# Patient Record
Sex: Female | Born: 1975
Health system: Southern US, Community
[De-identification: ages and names within clinical notes are randomized; demographics above are authoritative.]

## PROBLEM LIST (undated history)

## (undated) DIAGNOSIS — D219 Benign neoplasm of connective and other soft tissue, unspecified: Secondary | ICD-10-CM

## (undated) DIAGNOSIS — Z789 Other specified health status: Secondary | ICD-10-CM

## (undated) DIAGNOSIS — K5792 Diverticulitis of intestine, part unspecified, without perforation or abscess without bleeding: Secondary | ICD-10-CM

## (undated) HISTORY — DX: Benign neoplasm of connective and other soft tissue, unspecified: D21.9

---

## 2008-07-22 ENCOUNTER — Emergency Department (HOSPITAL_COMMUNITY): Admission: EM | Admit: 2008-07-22 | Discharge: 2008-07-22 | Payer: Self-pay | Admitting: Family Medicine

## 2008-07-29 ENCOUNTER — Ambulatory Visit (HOSPITAL_COMMUNITY): Admission: RE | Admit: 2008-07-29 | Discharge: 2008-07-29 | Payer: Self-pay | Admitting: Chiropractic Medicine

## 2010-12-27 ENCOUNTER — Other Ambulatory Visit: Payer: Self-pay | Admitting: Obstetrics & Gynecology

## 2010-12-27 DIAGNOSIS — O09529 Supervision of elderly multigravida, unspecified trimester: Secondary | ICD-10-CM

## 2011-01-22 LAB — HEPATITIS B SURFACE ANTIGEN: Hepatitis B Surface Ag: NEGATIVE

## 2011-01-22 LAB — ABO/RH: RH Type: POSITIVE

## 2011-01-22 LAB — HIV ANTIBODY (ROUTINE TESTING W REFLEX): HIV: NONREACTIVE

## 2011-01-23 ENCOUNTER — Other Ambulatory Visit: Payer: Self-pay | Admitting: Obstetrics & Gynecology

## 2011-01-23 ENCOUNTER — Ambulatory Visit (HOSPITAL_COMMUNITY)
Admission: RE | Admit: 2011-01-23 | Discharge: 2011-01-23 | Disposition: A | Discharge status: Home / Self Care | Payer: BC Managed Care – PPO | Source: Ambulatory Visit | Attending: Obstetrics & Gynecology | Admitting: Obstetrics & Gynecology

## 2011-01-23 ENCOUNTER — Encounter (HOSPITAL_COMMUNITY): Payer: Self-pay

## 2011-01-23 VITALS — BP 121/75 | HR 102 | Wt 147.0 lb

## 2011-01-23 DIAGNOSIS — Z0489 Encounter for examination and observation for other specified reasons: Secondary | ICD-10-CM

## 2011-01-23 DIAGNOSIS — O358XX0 Maternal care for other (suspected) fetal abnormality and damage, not applicable or unspecified: Secondary | ICD-10-CM | POA: Insufficient documentation

## 2011-01-23 DIAGNOSIS — O3410 Maternal care for benign tumor of corpus uteri, unspecified trimester: Secondary | ICD-10-CM | POA: Insufficient documentation

## 2011-01-23 DIAGNOSIS — O09529 Supervision of elderly multigravida, unspecified trimester: Secondary | ICD-10-CM | POA: Insufficient documentation

## 2011-01-23 DIAGNOSIS — Z1389 Encounter for screening for other disorder: Secondary | ICD-10-CM | POA: Insufficient documentation

## 2011-01-23 DIAGNOSIS — Z363 Encounter for antenatal screening for malformations: Secondary | ICD-10-CM | POA: Insufficient documentation

## 2011-01-23 NOTE — Progress Notes (Signed)
Genetic Counseling  High-Risk Gestation Note  Appointment Date:  01/23/2011 Referred By: Monica Hendrix, * Date of Birth:  Nov 20, 1975 Partner:  Monica Amour    [name] was referred for consultation for prenatal diagnosis given advanced maternal age.   Both family histories were reviewed and found to be contibutory  for the father of the pregnancy's brother born with a structural difference on his head, described as a knot, and spinal problems.  This relative reportedly had surgical correction of his head. Very limited information was known regarding his specific condition. He reportedly had no long-term effects and is healthy. Without further information regarding the provided family history, an accurate genetic risk cannot be calculated.   Further genetic counseling is warranted if more information is obtained.  We discussed sickle cell anemia (SCA) including: the features of SCA, autosomal recessive inheritance, the approximate 1 in 10 carrier frequency in the [place] American population, and the availability of carrier testing.  We also discussed that testing for SCA can be done prenatally if needed, and it is also routinely screened for on the newborn screening panel.  Medical records indicated that Monica Hendrix had sickle cell screening performed through her primary OB office, which was negative.   They were counseled regarding maternal age and the association with risk for chromosome conditions due to nondisjunction with aging of the ova.   We reviewed chromosomes, nondisjunction, and the associated 1 in 111 risk for fetal aneuploidy in the second trimester related to a maternal age of 3 at delivery.  They were counseled that the risk for aneuploidy decreases as gestational age increases, accounting for those pregnancies which spontaneously abort.  We specifically discussed Down syndrome (trisomy 21) and trisomies 57 and 18 including the common features and prognoses of each.    We  reviewed available screening and diagnostic options.  Regarding screening tests, we discussed the options of Quad screen and ultrasound.  They understand that screening tests are used to modify a patient's a priori risk for aneuploidy, typically based on age.  This estimate provides a pregnancy specific risk assessment.  We also reviewed the availability of diagnostic option of amniocentesis. A risk of 1 in 200-300 was given for amniocentesis, the primary complication being spontaneous pregnancy loss.  We discussed the risks, limitations, and benefits of each. It was discussed that not all birth defects or genetic conditions can be identified with these procedures. They were counseled that 50-80% of fetuses with Down syndrome and up to 90% of fetuses with trisomies 13 and 18, when well visualized, have detectable anomalies or soft markers by ultrasound. After reviewing these options, Monica Hendrix elected to proceed with targeted ultrasound only and declined Quad screening and amniocentesis at this time. Ultrasound performed today indicated the pregnancy to be 16 weeks and 2 days gestation. Visualized fetal anatomy appeared normal. However, fetal anatomic survey was limited due to gestational age.  Patient will return in 3 weeks to complete anatomic survey. She wishes to pursue these options to help ascertain her pregnancy specific risks for aneuploidy and will consider Quad screening and amniocentesis pending the results of follow-up ultrasound. They understand that ultrasound cannot rule out all birth defects or genetic syndromes.   The patient denied exposure to environmental toxins or chemical agents.  She denied the use of alcohol, tobacco or street drugs.  She denied significant viral illnesses during the course of her pregnancy.  Her medical and surgical history were noncontributory.    A complete obstetrical  ultrasound was performed at the time of today's evaluation.  The ultrasound report is reported  separately.      We counseled the patient for approximately 40 minutes regarding the above risks.     Monica Braun Jayron Maqueda, MS, [facility] 01/23/2011

## 2011-01-31 ENCOUNTER — Ambulatory Visit (HOSPITAL_COMMUNITY): Payer: BC Managed Care – PPO

## 2011-02-14 ENCOUNTER — Ambulatory Visit (HOSPITAL_COMMUNITY)
Admission: RE | Admit: 2011-02-14 | Discharge: 2011-02-14 | Disposition: A | Discharge status: Home / Self Care | Payer: BC Managed Care – PPO | Source: Ambulatory Visit | Attending: Obstetrics & Gynecology | Admitting: Obstetrics & Gynecology

## 2011-02-14 ENCOUNTER — Encounter (HOSPITAL_COMMUNITY): Payer: Self-pay

## 2011-02-14 ENCOUNTER — Other Ambulatory Visit: Payer: Self-pay | Admitting: Obstetrics & Gynecology

## 2011-02-14 VITALS — BP 123/72 | HR 95 | Wt 149.0 lb

## 2011-02-14 DIAGNOSIS — Z363 Encounter for antenatal screening for malformations: Secondary | ICD-10-CM | POA: Insufficient documentation

## 2011-02-14 DIAGNOSIS — O3410 Maternal care for benign tumor of corpus uteri, unspecified trimester: Secondary | ICD-10-CM | POA: Insufficient documentation

## 2011-02-14 DIAGNOSIS — O358XX0 Maternal care for other (suspected) fetal abnormality and damage, not applicable or unspecified: Secondary | ICD-10-CM | POA: Insufficient documentation

## 2011-02-14 DIAGNOSIS — Z0489 Encounter for examination and observation for other specified reasons: Secondary | ICD-10-CM

## 2011-02-14 DIAGNOSIS — Z1389 Encounter for screening for other disorder: Secondary | ICD-10-CM | POA: Insufficient documentation

## 2011-02-14 DIAGNOSIS — O09529 Supervision of elderly multigravida, unspecified trimester: Secondary | ICD-10-CM | POA: Insufficient documentation

## 2011-02-14 NOTE — Progress Notes (Signed)
Encounter addended by: Macarthur Critchley. Rachel Bo on: 02/14/2011  2:43 PM<BR>     Documentation filed: Orders

## 2011-02-14 NOTE — Progress Notes (Signed)
Encounter addended by: Macarthur Critchley. Rachel Bo on: 02/14/2011  2:41 PM<BR>     Documentation filed: Orders

## 2011-02-14 NOTE — Progress Notes (Signed)
See ultrasound report in [facility] 

## 2011-03-28 ENCOUNTER — Ambulatory Visit (HOSPITAL_COMMUNITY): Payer: BC Managed Care – PPO

## 2011-04-14 ENCOUNTER — Other Ambulatory Visit: Payer: Self-pay | Admitting: Obstetrics

## 2011-04-14 ENCOUNTER — Encounter (HOSPITAL_COMMUNITY): Payer: Self-pay | Admitting: Anesthesiology

## 2011-04-14 ENCOUNTER — Encounter (HOSPITAL_COMMUNITY): Payer: Self-pay

## 2011-04-14 ENCOUNTER — Inpatient Hospital Stay (HOSPITAL_COMMUNITY)
Admission: AD | Admit: 2011-04-14 | Discharge: 2011-04-17 | DRG: 371 | Disposition: A | Discharge status: Home / Self Care | Payer: BC Managed Care – PPO | Source: Ambulatory Visit | Attending: Obstetrics & Gynecology | Admitting: Obstetrics & Gynecology

## 2011-04-14 ENCOUNTER — Inpatient Hospital Stay (HOSPITAL_COMMUNITY): Payer: BC Managed Care – PPO | Admitting: Anesthesiology

## 2011-04-14 ENCOUNTER — Encounter (HOSPITAL_COMMUNITY)
Admission: AD | Disposition: A | Discharge status: Home / Self Care | Payer: Self-pay | Source: Ambulatory Visit | Attending: Obstetrics & Gynecology

## 2011-04-14 ENCOUNTER — Inpatient Hospital Stay (HOSPITAL_COMMUNITY): Payer: BC Managed Care – PPO

## 2011-04-14 DIAGNOSIS — O321XX0 Maternal care for breech presentation, not applicable or unspecified: Secondary | ICD-10-CM | POA: Diagnosis present

## 2011-04-14 DIAGNOSIS — O34219 Maternal care for unspecified type scar from previous cesarean delivery: Principal | ICD-10-CM | POA: Diagnosis present

## 2011-04-14 DIAGNOSIS — O09529 Supervision of elderly multigravida, unspecified trimester: Secondary | ICD-10-CM | POA: Diagnosis present

## 2011-04-14 DIAGNOSIS — D62 Acute posthemorrhagic anemia: Secondary | ICD-10-CM | POA: Diagnosis not present

## 2011-04-14 DIAGNOSIS — O42919 Preterm premature rupture of membranes, unspecified as to length of time between rupture and onset of labor, unspecified trimester: Secondary | ICD-10-CM

## 2011-04-14 HISTORY — DX: Other specified health status: Z78.9

## 2011-04-14 LAB — WET PREP, GENITAL
Clue Cells Wet Prep HPF POC: NONE SEEN
Trich, Wet Prep: NONE SEEN
Yeast Wet Prep HPF POC: NONE SEEN

## 2011-04-14 LAB — DIFFERENTIAL
Band Neutrophils: 1 % (ref 0–10)
Basophils Absolute: 0 10*3/uL (ref 0.0–0.1)
Basophils Relative: 0 % (ref 0–1)
Myelocytes: 0 %
Promyelocytes Absolute: 0 %

## 2011-04-14 LAB — CBC
HCT: 35.1 % — ABNORMAL LOW (ref 36.0–46.0)
Hemoglobin: 11.7 g/dL — ABNORMAL LOW (ref 12.0–15.0)
MCH: 32.6 pg (ref 26.0–34.0)
MCHC: 33.3 g/dL (ref 30.0–36.0)

## 2011-04-14 LAB — RPR: RPR Ser Ql: NONREACTIVE

## 2011-04-14 SURGERY — Surgical Case
Anesthesia: Spinal | Site: Abdomen | Wound class: Clean Contaminated

## 2011-04-14 MED ORDER — PHENYLEPHRINE 40 MCG/ML (10ML) SYRINGE FOR IV PUSH (FOR BLOOD PRESSURE SUPPORT)
PREFILLED_SYRINGE | INTRAVENOUS | Status: AC
  Filled 2011-04-14: qty 10

## 2011-04-14 MED ORDER — DOCUSATE SODIUM 100 MG PO CAPS: 100.0000 mg | ORAL_CAPSULE | Freq: Every day | ORAL | Status: DC

## 2011-04-14 MED ORDER — PHENYLEPHRINE 40 MCG/ML (10ML) SYRINGE FOR IV PUSH (FOR BLOOD PRESSURE SUPPORT)
PREFILLED_SYRINGE | INTRAVENOUS | Status: AC
  Filled 2011-04-14: qty 5

## 2011-04-14 MED ORDER — OXYTOCIN 10 UNIT/ML IJ SOLN
20.0000 [IU] | INTRAVENOUS | Status: DC | PRN
  Administered 2011-04-14: 20 [IU] via INTRAVENOUS

## 2011-04-14 MED ORDER — LACTATED RINGERS IV SOLN
INTRAVENOUS | Status: DC
  Administered 2011-04-14: 18:00:00 via INTRAVENOUS

## 2011-04-14 MED ORDER — MAGNESIUM SULFATE 40 G IN LACTATED RINGERS - SIMPLE
2.0000 g/h | INTRAVENOUS | Status: DC
  Filled 2011-04-14: qty 500

## 2011-04-14 MED ORDER — SODIUM CHLORIDE 0.9 % IJ SOLN: 3.0000 mL | INTRAMUSCULAR | Status: DC | PRN

## 2011-04-14 MED ORDER — ONDANSETRON HCL 4 MG/2ML IJ SOLN
INTRAMUSCULAR | Status: DC | PRN
  Administered 2011-04-14: 4 mg via INTRAVENOUS

## 2011-04-14 MED ORDER — ERYTHROMYCIN BASE 250 MG PO TABS
250.0000 mg | ORAL_TABLET | Freq: Four times a day (QID) | ORAL | Status: DC
  Filled 2011-04-14: qty 1

## 2011-04-14 MED ORDER — MORPHINE SULFATE (PF) 0.5 MG/ML IJ SOLN
INTRAMUSCULAR | Status: DC | PRN
  Administered 2011-04-14 (×4): 1 mg via INTRAVENOUS
  Administered 2011-04-14: .85 mg via INTRAVENOUS

## 2011-04-14 MED ORDER — CEFAZOLIN SODIUM 1-5 GM-% IV SOLN
INTRAVENOUS | Status: AC
  Filled 2011-04-14: qty 50

## 2011-04-14 MED ORDER — BETAMETHASONE SOD PHOS & ACET 6 (3-3) MG/ML IJ SUSP
12.0000 mg | INTRAMUSCULAR | Status: DC
  Administered 2011-04-14: 12 mg via INTRAMUSCULAR
  Filled 2011-04-14 (×2): qty 2

## 2011-04-14 MED ORDER — LACTATED RINGERS IV SOLN
INTRAVENOUS | Status: DC | PRN
Start: 2011-04-14 — End: 2011-04-14
  Administered 2011-04-14: 23:00:00 via INTRAVENOUS

## 2011-04-14 MED ORDER — FENTANYL CITRATE 0.05 MG/ML IJ SOLN
INTRAMUSCULAR | Status: AC
  Filled 2011-04-14: qty 2

## 2011-04-14 MED ORDER — SODIUM CHLORIDE 0.9 % IJ SOLN: 3.0000 mL | Freq: Two times a day (BID) | INTRAMUSCULAR | Status: DC

## 2011-04-14 MED ORDER — ZOLPIDEM TARTRATE 10 MG PO TABS: 10.0000 mg | ORAL_TABLET | Freq: Every evening | ORAL | Status: DC | PRN

## 2011-04-14 MED ORDER — ONDANSETRON HCL 4 MG/2ML IJ SOLN
INTRAMUSCULAR | Status: AC
  Filled 2011-04-14: qty 2

## 2011-04-14 MED ORDER — MORPHINE SULFATE 0.5 MG/ML IJ SOLN
INTRAMUSCULAR | Status: AC
  Filled 2011-04-14: qty 10

## 2011-04-14 MED ORDER — SODIUM CHLORIDE 0.9 % IV SOLN
2.0000 g | Freq: Four times a day (QID) | INTRAVENOUS | Status: DC
  Administered 2011-04-14 (×2): 2 g via INTRAVENOUS
  Filled 2011-04-14 (×4): qty 2000

## 2011-04-14 MED ORDER — OXYTOCIN 10 UNIT/ML IJ SOLN
INTRAMUSCULAR | Status: AC
  Filled 2011-04-14: qty 4

## 2011-04-14 MED ORDER — PRENATAL PLUS 27-1 MG PO TABS: 1.0000 | ORAL_TABLET | Freq: Every day | ORAL | Status: DC

## 2011-04-14 MED ORDER — CEFAZOLIN SODIUM 1-5 GM-% IV SOLN
1.0000 g | Freq: Three times a day (TID) | INTRAVENOUS | Status: DC
  Administered 2011-04-15 (×2): 1 g via INTRAVENOUS
  Filled 2011-04-14 (×3): qty 50

## 2011-04-14 MED ORDER — AMOXICILLIN 500 MG PO CAPS
500.0000 mg | ORAL_CAPSULE | Freq: Three times a day (TID) | ORAL | Status: DC
  Filled 2011-04-14: qty 1

## 2011-04-14 MED ORDER — MAGNESIUM SULFATE BOLUS VIA INFUSION
4.0000 g | Freq: Once | INTRAVENOUS | Status: AC
  Administered 2011-04-14: 4 g via INTRAVENOUS
  Filled 2011-04-14: qty 500

## 2011-04-14 MED ORDER — CALCIUM CARBONATE ANTACID 500 MG PO CHEW: 2.0000 | CHEWABLE_TABLET | ORAL | Status: DC | PRN

## 2011-04-14 MED ORDER — CITRIC ACID-SODIUM CITRATE 334-500 MG/5ML PO SOLN
ORAL | Status: AC
  Administered 2011-04-14: 30 mL
  Filled 2011-04-14: qty 15

## 2011-04-14 MED ORDER — CEFAZOLIN SODIUM 1-5 GM-% IV SOLN
INTRAVENOUS | Status: DC | PRN
  Administered 2011-04-14: 1 g via INTRAVENOUS

## 2011-04-14 MED ORDER — PHENYLEPHRINE HCL 10 MG/ML IJ SOLN
INTRAMUSCULAR | Status: DC | PRN
  Administered 2011-04-14: 80 ug via INTRAVENOUS
  Administered 2011-04-14 (×2): 120 ug via INTRAVENOUS
  Administered 2011-04-14 (×2): 80 ug via INTRAVENOUS
  Administered 2011-04-14: 120 ug via INTRAVENOUS

## 2011-04-14 MED ORDER — FENTANYL CITRATE 0.05 MG/ML IJ SOLN
INTRAMUSCULAR | Status: DC | PRN
  Administered 2011-04-14: 80 ug via INTRAVENOUS

## 2011-04-14 MED ORDER — SODIUM CHLORIDE 0.9 % IV SOLN
250.0000 mg | Freq: Four times a day (QID) | INTRAVENOUS | Status: DC
  Administered 2011-04-14 (×2): 250 mg via INTRAVENOUS
  Filled 2011-04-14 (×6): qty 250

## 2011-04-14 SURGICAL SUPPLY — 30 items
CLOTH BEACON ORANGE TIMEOUT ST (SAFETY) ×2 IMPLANT
CONTAINER PREFILL 10% NBF 15ML (MISCELLANEOUS) IMPLANT
DERMABOND ADVANCED (GAUZE/BANDAGES/DRESSINGS) ×1
DERMABOND ADVANCED .7 DNX12 (GAUZE/BANDAGES/DRESSINGS) ×1 IMPLANT
DRESSING TELFA 8X3 (GAUZE/BANDAGES/DRESSINGS) IMPLANT
ELECT REM PT RETURN 9FT ADLT (ELECTROSURGICAL) ×2
ELECTRODE REM PT RTRN 9FT ADLT (ELECTROSURGICAL) ×1 IMPLANT
EXTRACTOR VACUUM M CUP 4 TUBE (SUCTIONS) IMPLANT
GAUZE SPONGE 4X4 12PLY STRL LF (GAUZE/BANDAGES/DRESSINGS) IMPLANT
GLOVE BIO SURGEON STRL SZ8.5 (GLOVE) ×4 IMPLANT
GOWN PREVENTION PLUS LG XLONG (DISPOSABLE) ×4 IMPLANT
GOWN PREVENTION PLUS XXLARGE (GOWN DISPOSABLE) ×2 IMPLANT
KIT ABG SYR 3ML LUER SLIP (SYRINGE) ×2 IMPLANT
NEEDLE HYPO 25X5/8 SAFETYGLIDE (NEEDLE) ×2 IMPLANT
NS IRRIG 1000ML POUR BTL (IV SOLUTION) ×2 IMPLANT
PACK C SECTION WH (CUSTOM PROCEDURE TRAY) ×2 IMPLANT
PAD ABD 7.5X8 STRL (GAUZE/BANDAGES/DRESSINGS) IMPLANT
SLEEVE SCD COMPRESS KNEE MED (MISCELLANEOUS) IMPLANT
SUT CHROMIC 0 CT 802H (SUTURE) ×2 IMPLANT
SUT CHROMIC 1 CTX 36 (SUTURE) ×4 IMPLANT
SUT CHROMIC 2 0 SH (SUTURE) ×2 IMPLANT
SUT GUT PLAIN 0 CT-3 TAN 27 (SUTURE) IMPLANT
SUT MON AB 4-0 PS1 27 (SUTURE) ×2 IMPLANT
SUT VIC AB 0 CT1 18XCR BRD8 (SUTURE) IMPLANT
SUT VIC AB 0 CT1 8-18 (SUTURE)
SUT VIC AB 0 CTX 36 (SUTURE) ×2
SUT VIC AB 0 CTX36XBRD ANBCTRL (SUTURE) ×2 IMPLANT
TOWEL OR 17X24 6PK STRL BLUE (TOWEL DISPOSABLE) ×4 IMPLANT
TRAY FOLEY CATH 14FR (SET/KITS/TRAYS/PACK) ×2 IMPLANT
WATER STERILE IRR 1000ML POUR (IV SOLUTION) ×2 IMPLANT

## 2011-04-14 NOTE — H&P (Signed)
Chief Complaint:  Vaginal Discharge   Monica Hendrix is  35 y.o. G3P1011. [redacted]w[redacted]d  Patient's last menstrual period was 09/19/2010.Marland Kitchen  Her pregnancy status is positive.  She presents complaining of Vaginal Discharge Questionable ROM last night requiring pt to wear panty liner and change frequently due to being wet. Onset is described as sudden and has been present for  1 days. Clear fluid  Obstetrical/Gynecological History: OB History    Grav Para Term Preterm Abortions TAB SAB Ect Mult Living   3 1 1  0 1 0 1 0 0 1      Past Medical History: Past Medical History  Diagnosis Date  . No pertinent past medical history     Past Surgical History: Past Surgical History  Procedure Date  . Cesarean section     Family History: No family history on file.  Social History: History  Substance Use Topics  . Smoking status: Unknown If Ever Smoked  . Smokeless tobacco: Not on file  . Alcohol Use: No    Allergies:  Allergies  Allergen Reactions  . Vicodin (Hydrocodone-Acetaminophen) Itching    Prescriptions prior to admission  Medication Sig Dispense Refill  . DM-Phenylephrine-Acetaminophen (TYLENOL COLD HEAD CONGESTION) 10-5-325 MG TABS Take 2 tablets by mouth once. For cold symptoms       . Prenatal Vit-FePoly-FA-DHA (VITAFOL-ONE) 29-1-200 MG CAPS Take 1 tablet by mouth daily.         Review of Systems - Negative except what has been reviewed in the HPI  Physical Exam   Blood pressure 128/90, pulse 121, temperature 98.9 F (37.2 C), temperature source Oral, resp. rate 16, height 5\' 2"  (1.575 m), weight 67.677 kg (149 lb 3.2 oz), last menstrual period 09/19/2010.  General: General appearance - alert, well appearing, and in no distress, oriented to person, place, and time and normal appearing weight Abdomen - gravid, nontender Extremities - peripheral pulses normal, no pedal edema, no clubbing or cyanosis Focused Gynecological Exam: VULVA: normal appearing vulva with no masses,  tenderness or lesions, VAGINA: +pooling clear fluid, CERVIX: visually closed  Labs: + Fern    Assessment: PPROM at [redacted]w[redacted]d Fetal Testing c/w Well-being  Patient Active Problem List  Diagnoses  . AMA (advanced maternal age) multigravida 35+    Plan: Admit to Ante [facility]X for latency [place] and labs pending  Angelea Penny E. 04/14/2011,10:52 AM

## 2011-04-14 NOTE — OR Nursing (Signed)
Fundal massage DLWegner RN, cord ph 7.29

## 2011-04-14 NOTE — Progress Notes (Signed)
  This afternoon Monica Hendrix started contracting and she was started on magnesium 4 g loading and 2 g an hour At first her contractions decreased in intensity But now they have become irregular every 5 minutes and she is 4 cm dilated She will be delivered from by C-section because she had a previous C-section and she's also breech presentation

## 2011-04-14 NOTE — Progress Notes (Signed)
1810-drGaynell Face notified of pt's contraction pattern. Orders received.

## 2011-04-14 NOTE — Progress Notes (Signed)
1701- pt up to empty her bladder.

## 2011-04-14 NOTE — Transfer of Care (Signed)
Immediate Anesthesia Transfer of Care Note  Patient: Monica Hendrix  Procedure(s) Performed:  CESAREAN SECTION - Repeat of baby girl at 2300 APGAR 2/7  Patient Location: PACU  Anesthesia Type: Spinal  Level of Consciousness: awake, alert  and oriented  Airway & Oxygen Therapy: Patient Spontanous Breathing  Post-op Assessment: Report given to PACU RN and Post -op Vital signs reviewed and stable  Post vital signs: stable  Complications: No apparent anesthesia complications

## 2011-04-14 NOTE — Anesthesia Preprocedure Evaluation (Signed)
Anesthesia Evaluation  Name, MR# and DOB Patient awake  General Assessment Comment  Reviewed: Allergy & Precautions, H&P , Patient's Chart, lab work & pertinent test results  Airway Mallampati: II TM Distance: >3 FB Neck ROM: full    Dental  (+) Teeth Intact   Pulmonary  clear to auscultation        Cardiovascular regular Normal    Neuro/Psych    GI/Hepatic   Endo/Other    Renal/GU      Musculoskeletal   Abdominal   Peds  Hematology   Anesthesia Other Findings       Reproductive/Obstetrics (+) Pregnancy                           Anesthesia Physical Anesthesia Plan  ASA: II  Anesthesia Plan: Epidural   Post-op Pain Management:    Induction:   Airway Management Planned:   Additional Equipment:   Intra-op Plan:   Post-operative Plan:   Informed Consent: I have reviewed the patients History and Physical, chart, labs and discussed the procedure including the risks, benefits and alternatives for the proposed anesthesia with the patient or authorized representative who has indicated his/her understanding and acceptance.   Dental Advisory Given  Plan Discussed with: CRNA  Anesthesia Plan Comments: (Lab work confirmed with CRNA in room. Platelets okay. Discussed spinal anesthetic, and patient consents to the procedure:  included risk of possible headache,backache, failed block, allergic reaction, and nerve injury. This patient was asked if she had any questions or concerns before the procedure started. )        Anesthesia Quick Evaluation

## 2011-04-14 NOTE — Progress Notes (Signed)
Pt states that she has started to feel ctx, rates them a 7 on pain scale, Dr.Marshall notified. Will perform and vaginal exam.

## 2011-04-14 NOTE — Brief Op Note (Signed)
04/14/2011  11:41 PM  PATIENT:  Monica Hendrix  35 y.o. female  PRE-OPERATIVE DIAGNOSIS:  breech, ruptured, previous cesarean section  POST-OPERATIVE DIAGNOSIS:  breech, ruptured, previous cesarean section  PROCEDURE:  Procedure(s): CESAREAN SECTION  SURGEON:  Surgeon(s): Kathreen Cosier, MD  PHYSICIAN ASSISTANT:   ASSISTANTS: none   ANESTHESIA:   spinal  OR FLUID I/O:  Total I/O In: 1537.5 [I.V.:1437.5; IV Piggyback:100] Out: 1800 [Urine:1200; Blood:600]  BLOOD ADMINISTERED:none  DRAINS: none   LOCAL MEDICATIONS USED:  NONE  SPECIMEN:  No Specimen  DISPOSITION OF SPECIMEN:  PATHOLOGY  COUNTS:  YES  TOURNIQUET:  * No tourniquets in log *  DICTATION: .Dragon Dictation  PLAN OF CARE: Admit to inpatient   PATIENT DISPOSITION:  PACU - hemodynamically stable.   Delay start of Pharmacological VTE agent (>24hrs) due to surgical blood loss or risk of bleeding:  not applicable       This is Dr. Francoise Ceo dictating the operative note on CAR at a [facility] Preop diagnosis 27 weeks IUP with ruptured membranes breech presentation and labor postop Postop diagnosis the same Anesthesia spinal Surgeon Dr. Lewis Moccasin Procedure Patient placed on the operating table in the supine position after the spinal administered abdomen prepped and draped in the bladder and to the Foley catheter Transverse incision made to the old scar carried him to the rectus fascia fascia cleaned and incised the length of the incision Recti muscles retracted laterally peritoneum incised longitudinally Using the Metzenbaum scissors the bladder was dissected off the lower segment a transverse low uterine incision was made It was noted that the legs and a him in the bottom of the baby were already in the vagina so the patient's legs were frog in the nurse pushed up on the fetus the vagina was a double footling breech female Apgar 27 and delivery was not difficult once they have buttocks were  pushed up into the operative field Placenta was removed manually and sent to pathology cord pH was 7.29 uterine cavity clean with dry laps uterine incision closed in one layer with continuous looped abnormal one chromic hemostasis was satisfactory and the bladder flap protected full chromic the uterus was well contracted tubes and ovaries normal The abdomen was closed in layers with continuous within of 0 chromic to close the peritoneum The fascia was closed with 0 Vicryl And the skin was closed in subcuticular stitch of 4-0 Monocryl blood loss was 600 cc patient tolerated the procedure well taken to recovery room in good condition end of dictation dictated by Dr. Gaynell Face [phone]

## 2011-04-14 NOTE — ED Provider Notes (Signed)
Chief Complaint:  Vaginal Discharge   Monica Hendrix is  35 y.o. G3P1011. [redacted]w[redacted]d  Patient's last menstrual period was 09/19/2010..  Her pregnancy status is positive.  She presents complaining of Vaginal Discharge Questionable ROM last night requiring pt to wear panty liner and change frequently due to being wet. Onset is described as sudden and has been present for  1 days. Clear fluid  Obstetrical/Gynecological History: OB History    Grav Para Term Preterm Abortions TAB SAB Ect Mult Living   3 1 1 0 1 0 1 0 0 1      Past Medical History: Past Medical History  Diagnosis Date  . No pertinent past medical history     Past Surgical History: Past Surgical History  Procedure Date  . Cesarean section     Family History: No family history on file.  Social History: History  Substance Use Topics  . Smoking status: Unknown If Ever Smoked  . Smokeless tobacco: Not on file  . Alcohol Use: No    Allergies:  Allergies  Allergen Reactions  . Vicodin (Hydrocodone-Acetaminophen) Itching    Prescriptions prior to admission  Medication Sig Dispense Refill  . DM-Phenylephrine-Acetaminophen (TYLENOL COLD HEAD CONGESTION) 10-5-325 MG TABS Take 2 tablets by mouth once. For cold symptoms       . Prenatal Vit-FePoly-FA-DHA (VITAFOL-ONE) 29-1-200 MG CAPS Take 1 tablet by mouth daily.         Review of Systems - Negative except what has been reviewed in the HPI  Physical Exam   Blood pressure 128/90, pulse 121, temperature 98.9 F (37.2 C), temperature source Oral, resp. rate 16, height 5' 2" (1.575 m), weight 67.677 kg (149 lb 3.2 oz), last menstrual period 09/19/2010.  General: General appearance - alert, well appearing, and in no distress, oriented to person, place, and time and normal appearing weight Abdomen - gravid, nontender Extremities - peripheral pulses normal, no pedal edema, no clubbing or cyanosis Focused Gynecological Exam: VULVA: normal appearing vulva with no masses,  tenderness or lesions, VAGINA: +pooling clear fluid, CERVIX: visually closed  Labs: + Fern    Assessment: PPROM at [redacted]w[redacted]d Fetal Testing c/w Well-being  Patient Active Problem List  Diagnoses  . AMA (advanced maternal age) multigravida 35+    Plan: Admit to Ante [facility] for latency US and labs pending  Lorre Opdahl E. 04/14/2011,10:52 AM   

## 2011-04-14 NOTE — Progress Notes (Signed)
Onset of leaking fluid since last night, wearing panty liners which are consistently wet, no cramping, no pressure, no bleeding.

## 2011-04-14 NOTE — H&P (Signed)
This is Dr. Francoise Ceo dictating the history and physical on  Monica Hendrix She's a 35 year old gravida 3 para 1001 at 27 weeks and 6 days Her membranes ruptured at 7:30 AM today fluid was clear She is not in labor And her GBS is unknown and she was started on ampicillin and erythromycin IV She's had no problems with this pregnancy and she will be started on her betamethasone 12.5 IM every 12 hours x2 Past medical history negative Past surgical history negative Social history negative System review noncontributory Physical exam well-developed female in no distress HEENT negative Lungs clear Heart regular rhythm no murmurs no gallops Breasts negative Abdomen 28 week size Pelvic deferred Extremities negative

## 2011-04-14 NOTE — Progress Notes (Signed)
VE 4cm, pt now breathing with ctx, Dr.Marshall updated, on his way to perform c/s

## 2011-04-14 NOTE — Anesthesia Procedure Notes (Signed)
Spinal Block  Start time: 04/14/2011 10:32 PM Staffing Anesthesiologist: Jiles Garter Additional Notes Spinal Dosage in OR  Bupivicaine ml       1.6 PFMS04   mcg        150 Fentanyl mcg            20

## 2011-04-15 ENCOUNTER — Encounter (HOSPITAL_COMMUNITY): Payer: Self-pay | Admitting: *Deleted

## 2011-04-15 LAB — CBC
HCT: 29.7 % — ABNORMAL LOW (ref 36.0–46.0)
Hemoglobin: 10.2 g/dL — ABNORMAL LOW (ref 12.0–15.0)
RBC: 3.14 MIL/uL — ABNORMAL LOW (ref 3.87–5.11)
WBC: 24.8 10*3/uL — ABNORMAL HIGH (ref 4.0–10.5)

## 2011-04-15 MED ORDER — DIPHENHYDRAMINE HCL 25 MG PO CAPS: 25.0000 mg | ORAL_CAPSULE | Freq: Four times a day (QID) | ORAL | Status: DC | PRN

## 2011-04-15 MED ORDER — MEPERIDINE HCL 25 MG/ML IJ SOLN: 6.2500 mg | INTRAMUSCULAR | Status: DC | PRN

## 2011-04-15 MED ORDER — ONDANSETRON HCL 4 MG/2ML IJ SOLN: 4.0000 mg | INTRAMUSCULAR | Status: DC | PRN

## 2011-04-15 MED ORDER — IBUPROFEN 600 MG PO TABS: 600.0000 mg | ORAL_TABLET | Freq: Four times a day (QID) | ORAL | Status: DC

## 2011-04-15 MED ORDER — SIMETHICONE 80 MG PO CHEW
80.0000 mg | CHEWABLE_TABLET | ORAL | Status: DC | PRN
  Administered 2011-04-15: 80 mg via ORAL

## 2011-04-15 MED ORDER — KETOROLAC TROMETHAMINE 60 MG/2ML IM SOLN
INTRAMUSCULAR | Status: AC
  Administered 2011-04-15: 60 mg via INTRAMUSCULAR
  Filled 2011-04-15: qty 2

## 2011-04-15 MED ORDER — KETOROLAC TROMETHAMINE 30 MG/ML IJ SOLN: 30.0000 mg | Freq: Four times a day (QID) | INTRAMUSCULAR | Status: DC | PRN

## 2011-04-15 MED ORDER — PRENATAL PLUS 27-1 MG PO TABS
1.0000 | ORAL_TABLET | Freq: Every day | ORAL | Status: DC
  Administered 2011-04-15 – 2011-04-17 (×3): 1 via ORAL
  Filled 2011-04-15 (×4): qty 1

## 2011-04-15 MED ORDER — LACTATED RINGERS IV SOLN: INTRAVENOUS | Status: DC

## 2011-04-15 MED ORDER — METOCLOPRAMIDE HCL 5 MG/ML IJ SOLN: 10.0000 mg | Freq: Three times a day (TID) | INTRAMUSCULAR | Status: DC | PRN

## 2011-04-15 MED ORDER — ZOLPIDEM TARTRATE 5 MG PO TABS: 5.0000 mg | ORAL_TABLET | Freq: Every evening | ORAL | Status: DC | PRN

## 2011-04-15 MED ORDER — NALBUPHINE HCL 10 MG/ML IJ SOLN
5.0000 mg | INTRAMUSCULAR | Status: DC | PRN
  Filled 2011-04-15: qty 1

## 2011-04-15 MED ORDER — LANOLIN HYDROUS EX OINT: 1.0000 "application " | TOPICAL_OINTMENT | CUTANEOUS | Status: DC | PRN

## 2011-04-15 MED ORDER — DIBUCAINE 1 % RE OINT: 1.0000 "application " | TOPICAL_OINTMENT | RECTAL | Status: DC | PRN

## 2011-04-15 MED ORDER — LACTATED RINGERS IV SOLN
INTRAVENOUS | Status: DC
  Administered 2011-04-15: 06:00:00 via INTRAVENOUS

## 2011-04-15 MED ORDER — OXYCODONE-ACETAMINOPHEN 5-325 MG PO TABS: 1.0000 | ORAL_TABLET | ORAL | Status: DC | PRN

## 2011-04-15 MED ORDER — ONDANSETRON HCL 4 MG PO TABS: 4.0000 mg | ORAL_TABLET | ORAL | Status: DC | PRN

## 2011-04-15 MED ORDER — IBUPROFEN 600 MG PO TABS: 600.0000 mg | ORAL_TABLET | Freq: Four times a day (QID) | ORAL | Status: DC | PRN

## 2011-04-15 MED ORDER — IBUPROFEN 600 MG PO TABS
600.0000 mg | ORAL_TABLET | Freq: Four times a day (QID) | ORAL | Status: DC
  Administered 2011-04-15 – 2011-04-17 (×9): 600 mg via ORAL
  Filled 2011-04-15 (×9): qty 1

## 2011-04-15 MED ORDER — MENTHOL 3 MG MT LOZG: 1.0000 | LOZENGE | OROMUCOSAL | Status: DC | PRN

## 2011-04-15 MED ORDER — SIMETHICONE 80 MG PO CHEW
80.0000 mg | CHEWABLE_TABLET | Freq: Three times a day (TID) | ORAL | Status: DC
  Administered 2011-04-15 – 2011-04-17 (×8): 80 mg via ORAL

## 2011-04-15 MED ORDER — SODIUM CHLORIDE 0.9 % IJ SOLN: 3.0000 mL | INTRAMUSCULAR | Status: DC | PRN

## 2011-04-15 MED ORDER — OXYTOCIN 20 UNITS IN LACTATED RINGERS INFUSION - SIMPLE
125.0000 mL/h | INTRAVENOUS | Status: DC
  Filled 2011-04-15: qty 1000

## 2011-04-15 MED ORDER — NALOXONE HCL 0.4 MG/ML IJ SOLN: 0.4000 mg | INTRAMUSCULAR | Status: DC | PRN

## 2011-04-15 MED ORDER — SIMETHICONE 80 MG PO CHEW: 80.0000 mg | CHEWABLE_TABLET | ORAL | Status: DC | PRN

## 2011-04-15 MED ORDER — DIPHENHYDRAMINE HCL 50 MG/ML IJ SOLN
12.5000 mg | INTRAMUSCULAR | Status: DC | PRN
  Administered 2011-04-15: 12.5 mg via INTRAVENOUS

## 2011-04-15 MED ORDER — VITAFOL-ONE 29-1-200 MG PO CAPS
1.0000 | ORAL_CAPSULE | Freq: Every day | ORAL | Status: DC
  Filled 2011-04-15: qty 1

## 2011-04-15 MED ORDER — WITCH HAZEL-GLYCERIN EX PADS: 1.0000 "application " | MEDICATED_PAD | CUTANEOUS | Status: DC | PRN

## 2011-04-15 MED ORDER — SODIUM CHLORIDE 0.9 % IV SOLN
1.0000 ug/kg/h | INTRAVENOUS | Status: DC | PRN
  Filled 2011-04-15: qty 2.5

## 2011-04-15 MED ORDER — DIPHENHYDRAMINE HCL 50 MG/ML IJ SOLN
25.0000 mg | INTRAMUSCULAR | Status: DC | PRN
  Filled 2011-04-15: qty 1

## 2011-04-15 MED ORDER — OXYTOCIN 20 UNITS IN LACTATED RINGERS INFUSION - SIMPLE
125.0000 mL/h | INTRAVENOUS | Status: AC
  Filled 2011-04-15: qty 1000

## 2011-04-15 MED ORDER — TETANUS-DIPHTH-ACELL PERTUSSIS 5-2.5-18.5 LF-MCG/0.5 IM SUSP
0.5000 mL | Freq: Once | INTRAMUSCULAR | Status: AC
  Administered 2011-04-16: 0.5 mL via INTRAMUSCULAR
  Filled 2011-04-15: qty 0.5

## 2011-04-15 MED ORDER — SCOPOLAMINE 1 MG/3DAYS TD PT72
1.0000 | MEDICATED_PATCH | Freq: Once | TRANSDERMAL | Status: DC
  Filled 2011-04-15: qty 1

## 2011-04-15 MED ORDER — ONDANSETRON HCL 4 MG/2ML IJ SOLN: 4.0000 mg | Freq: Three times a day (TID) | INTRAMUSCULAR | Status: DC | PRN

## 2011-04-15 MED ORDER — KETOROLAC TROMETHAMINE 30 MG/ML IJ SOLN
30.0000 mg | Freq: Four times a day (QID) | INTRAMUSCULAR | Status: DC | PRN
  Administered 2011-04-15: 30 mg via INTRAVENOUS
  Filled 2011-04-15: qty 1

## 2011-04-15 MED ORDER — SENNOSIDES-DOCUSATE SODIUM 8.6-50 MG PO TABS
2.0000 | ORAL_TABLET | Freq: Every day | ORAL | Status: DC
  Administered 2011-04-15 – 2011-04-16 (×2): 2 via ORAL

## 2011-04-15 MED ORDER — SIMETHICONE 80 MG PO CHEW: 80.0000 mg | CHEWABLE_TABLET | Freq: Three times a day (TID) | ORAL | Status: DC

## 2011-04-15 MED ORDER — OXYTOCIN 20 UNITS IN LACTATED RINGERS INFUSION - SIMPLE
INTRAVENOUS | Status: AC
  Administered 2011-04-15: 20 [IU]
  Filled 2011-04-15: qty 1000

## 2011-04-15 MED ORDER — TETANUS-DIPHTH-ACELL PERTUSSIS 5-2.5-18.5 LF-MCG/0.5 IM SUSP
0.5000 mL | Freq: Once | INTRAMUSCULAR | Status: DC
  Filled 2011-04-15: qty 0.5

## 2011-04-15 MED ORDER — PRENATAL PLUS 27-1 MG PO TABS: 1.0000 | ORAL_TABLET | Freq: Every day | ORAL | Status: DC

## 2011-04-15 MED ORDER — KETOROLAC TROMETHAMINE 60 MG/2ML IM SOLN
60.0000 mg | Freq: Once | INTRAMUSCULAR | Status: AC | PRN
  Administered 2011-04-15: 60 mg via INTRAMUSCULAR

## 2011-04-15 MED ORDER — SENNOSIDES-DOCUSATE SODIUM 8.6-50 MG PO TABS: 2.0000 | ORAL_TABLET | Freq: Every day | ORAL | Status: DC

## 2011-04-15 MED ORDER — DIPHENHYDRAMINE HCL 25 MG PO CAPS: 25.0000 mg | ORAL_CAPSULE | ORAL | Status: DC | PRN

## 2011-04-15 NOTE — Progress Notes (Signed)
PSYCHOSOCIAL ASSESSMENT ~ MATERNAL/CHILD Name: Monica Andrea Smith_______________________________________         Age___28 weeks gest._______  __________________________________________________________________________________________  Referral Date ___9____/___30_____/_____12___  Reason/Source_NICU support__________________  I. FAMILY/HOME ENVIRONMENT A. Child's Legal Guardian X Parent(s) q Grandparent q Foster parent q DSS _______________________ Name____[name]_____________________________DOB 10__/19_/_1977   Age_____35______ Address [address] [place],  [address]________________________________ Name____[name]___________________________ DOB_[birth year]   Age___43________  Address ____same as MOB  B. Other Household Members/Support Persons Name___Mya Bryant___13yo___________      Relationship_big sister    DOB __[birth year]__1998__        Name_______________________________       Relationship__________________   DOB ____/____/____        Name_______________________________       Relationship__________________   DOB ____/____/____                    Name_______________________________       Relationship__________________   DOB ____/____/____ C.   Other Support _____Many supportive friends and extended family in the area. _______________________________________________________________________ II. PSYCHOSOCIAL DATA A. Information Source X Patient Interview  XFamily Interview           XOther _____chart review and discussion with RN._ B. 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 Cultural and Environment Information Cultural Issues Impacting Care________none_______________________________________________________ III. STRENGTHS X Supportive family/friends   XAdequate Resources X Compliance with medical plan  q Home prepared for Child (including basic supplies)                X Understanding of illness           XOther____have transportation to visit and means to obtain baby items. ___ IV. RISK FACTORS AND CURRENT PROBLEMS        No Problems Noted            Pt Family      Pt Family  Substance Abuse_________________    q  q   Mental Illness_______________ q  q  Family/Relationship Issues   q  q         Abuse/Neglect/Domestic Violence  q  q   Financial Resources    q     q             Transportation                                  q     q                     DSS Involvement                                q     q    Adjustment to Illness                      q              

## 2011-04-15 NOTE — Progress Notes (Signed)
  Postop day 1 Vital signs normal Fundus firm Legs negative No complaints

## 2011-04-15 NOTE — Plan of Care (Signed)
Problem: Discharge Progression Outcomes Goal: Remove staples per MD order Outcome: Not Applicable Date Met:  04/15/11 No staples,incision with skin glue.

## 2011-04-15 NOTE — Anesthesia Postprocedure Evaluation (Signed)
  Anesthesia Post-op Note  Patient: Monica Hendrix  Procedure(s) Performed:  CESAREAN SECTION - Repeat of baby girl at 2300 APGAR 2/7  Patient Location: PACU and Women's Unit  Anesthesia Type: Spinal  Level of Consciousness: awake, alert  and oriented  Airway and Oxygen Therapy: Patient Spontanous Breathing  Post-op Assessment: Patient's Cardiovascular Status Stable and Respiratory Function Stable  Post-op Vital Signs: stable  Complications: No apparent anesthesia complications

## 2011-04-16 ENCOUNTER — Encounter (HOSPITAL_COMMUNITY): Payer: Self-pay | Admitting: Obstetrics

## 2011-04-16 DIAGNOSIS — D62 Acute posthemorrhagic anemia: Secondary | ICD-10-CM | POA: Diagnosis not present

## 2011-04-16 LAB — CBC
HCT: 26.9 % — ABNORMAL LOW (ref 36.0–46.0)
Hemoglobin: 9.3 g/dL — ABNORMAL LOW (ref 12.0–15.0)
RBC: 2.83 MIL/uL — ABNORMAL LOW (ref 3.87–5.11)
RDW: 12.7 % (ref 11.5–15.5)
WBC: 19.3 10*3/uL — ABNORMAL HIGH (ref 4.0–10.5)

## 2011-04-16 LAB — CULTURE, BETA STREP (GROUP B ONLY)

## 2011-04-16 NOTE — Progress Notes (Signed)
UR chart review completed.  

## 2011-04-16 NOTE — Progress Notes (Signed)
  Subjective: POD# 2 s/p Cesarean Delivery.  Indications: breech and PTL  RH status/Rubella reviewed. Feeding: unknown Patient reports tolerating PO.  Denies HA/SOB/C/P/N/V/dizziness.  Reports flatus or BM. Breast symptoms:None.  She reports vaginal bleeding as normal, without clots.  She is ambulating, urinating without difficulty.     Objective: Vital signs in last 24 hours: BP 109/70  Pulse 88  Temp(Src) 97.7 F (36.5 C) (Oral)  Resp 18  Ht 5\' 2"  (1.575 m)  Wt 67.677 kg (149 lb 3.2 oz)  BMI 27.29 kg/m2  SpO2 98%  LMP 09/19/2010  Breastfeeding? Unknown       Physical Exam:  General: alert CV: Regular rate and rhythm Resp: clear Abdomen: soft, nontender, normal bowel sounds Lochia: minimal Uterine Fundus: firm, below umbilicus, nontender Incision: clean, dry and intact Ext: extremities normal, atraumatic, no cyanosis or edema    Basename 04/16/11 0441 04/15/11 0533  HGB 9.3* 10.2*  HCT 26.9* 29.7*      Assessment/Plan: 35 y.o.  status post Cesarean section. POD# 2.   Doing well, stable.               Ambulate IS Routine post-op care  JACKSON-MOORE,Lamaria Hildebrandt A 04/16/2011, 8:25 AM

## 2011-04-16 NOTE — Anesthesia Postprocedure Evaluation (Signed)
  Anesthesia Post-op Note  Patient: Monica Hendrix  Procedure(s) Performed:  CESAREAN SECTION - Repeat of baby girl at 2300 APGAR 2/7   Patient is awake, responsive, moving her legs, and has signs of resolution of her numbness. Pain and nausea are reasonably well controlled. Vital signs are stable and clinically acceptable. Oxygen saturation is clinically acceptable. There are no apparent anesthetic complications at this time. Patient is ready for discharge.

## 2011-04-17 MED ORDER — OXYCODONE-ACETAMINOPHEN 5-325 MG PO TABS: 1.0000 | ORAL_TABLET | ORAL | Status: DC | PRN

## 2011-04-17 MED ORDER — IBUPROFEN 600 MG PO TABS: 600.0000 mg | ORAL_TABLET | Freq: Four times a day (QID) | ORAL | Status: DC

## 2011-04-17 NOTE — Discharge Summary (Signed)
Obstetric Discharge Summary Reason for Admission: rupture of membranes Prenatal Procedures: none Intrapartum Procedures: cesarean: low cervical, transverse Postpartum Procedures: none Complications-Operative and Postpartum: none Hemoglobin  Date Value Range Status  04/16/2011 9.3* 12.0-15.0 (g/dL) Final     HCT  Date Value Range Status  04/16/2011 26.9* 36.0-46.0 (%) Final    Discharge Diagnoses: PTD  Discharge Information: Date: 04/17/2011 Activity: pelvic rest Diet: routine Medications: PNV, Ibuprofen and Percocet Condition: stable Instructions: See above. Discharge to: home Follow-up Information    Follow up with Antionette Char A, MD. Call in 2 weeks.   Contact information:   [address] [place] [address] [phone]          Newborn Data: Live born female  Birth Weight: 2 lb 5 oz (1050 g) APGAR: 2, 7  Home with NICU.  JACKSON-MOORE,Alie Hardgrove A 04/17/2011, 6:35 AM

## 2011-04-17 NOTE — Progress Notes (Signed)
Pt discharged to home with husband.  Condition stable.  Pt and husband ambulated to NICU with intention to leave hospital from there.  No equipment for home ordered at discharge. 

## 2011-04-17 NOTE — Progress Notes (Signed)
Subjective: POD #3 s/p LTC/S  Indication:PTL/breech Patient reports tolerating PO.  Denies HA/SOB/C/P/N/V/dizziness.  Reports flatus or BM. Breast symptoms: None.  She reports vaginal bleeding as normal, without clots.  She is ambulating, urinating without difficulty.     Objective: Vital signs in last 24 hours: BP 115/73  Pulse 82  Temp(Src) 98.5 F (36.9 C) (Oral)  Resp 18  Ht 5\' 2"  (1.575 m)  Wt 67.677 kg (149 lb 3.2 oz)  BMI 27.29 kg/m2  SpO2 98%  LMP 09/19/2010  Breastfeeding? Unknown  Physical Exam:  General: alert CV: Regular rate and rhythm Resp: clear Abdomen: soft, nontender, normal bowel sounds Lochia: minimal Uterine Fundus: firm, below umbilicus, nontender Incision: clean, dry and intact Ext: extremities normal, atraumatic, no cyanosis or edema  Basename 04/16/11 0441 04/15/11 0533  HGB 9.3* 10.2*  HCT 26.9* 29.7*    Assessment/Plan: 35 y.o. status post Cesarean section POD# 3.  normal post-operative exam  Routine post-op care D/C home  JACKSON-MOORE,Frankee Gritz A 04/17/2011, 6:30 AM

## 2011-04-23 ENCOUNTER — Inpatient Hospital Stay (HOSPITAL_COMMUNITY)
Admission: EM | Admit: 2011-04-23 | Discharge: 2011-04-26 | DRG: 376 | Disposition: A | Discharge status: Home / Self Care | Payer: BC Managed Care – PPO | Source: Ambulatory Visit | Attending: Obstetrics & Gynecology | Admitting: Obstetrics & Gynecology

## 2011-04-23 DIAGNOSIS — O8600 Infection of obstetric surgical wound, unspecified: Secondary | ICD-10-CM

## 2011-04-23 DIAGNOSIS — IMO0002 Reserved for concepts with insufficient information to code with codable children: Secondary | ICD-10-CM | POA: Diagnosis present

## 2011-04-23 DIAGNOSIS — O909 Complication of the puerperium, unspecified: Principal | ICD-10-CM | POA: Diagnosis present

## 2011-04-23 LAB — DIFFERENTIAL
Eosinophils Relative: 0 % (ref 0–5)
Lymphs Abs: 2.1 10*3/uL (ref 0.7–4.0)
Monocytes Absolute: 1.8 10*3/uL — ABNORMAL HIGH (ref 0.1–1.0)
Neutro Abs: 19 10*3/uL — ABNORMAL HIGH (ref 1.7–7.7)

## 2011-04-23 LAB — COMPREHENSIVE METABOLIC PANEL
BUN: 11 mg/dL (ref 6–23)
CO2: 25 mEq/L (ref 19–32)
Calcium: 9.1 mg/dL (ref 8.4–10.5)
Creatinine, Ser: 0.62 mg/dL (ref 0.50–1.10)
GFR calc Af Amer: >90 mL/min (ref >90)
GFR calc non Af Amer: >90 mL/min (ref >90)
Glucose, Bld: 103 mg/dL — ABNORMAL HIGH (ref 70–99)
Total Bilirubin: 0.3 mg/dL (ref 0.3–1.2)

## 2011-04-23 LAB — CBC
HCT: 31 % — ABNORMAL LOW (ref 36.0–46.0)
MCH: 32.1 pg (ref 26.0–34.0)
MCV: 93.9 fL (ref 78.0–100.0)
Platelets: 459 10*3/uL — ABNORMAL HIGH (ref 150–400)
RBC: 3.3 MIL/uL — ABNORMAL LOW (ref 3.87–5.11)

## 2011-04-23 MED ORDER — GENTAMICIN SULFATE 40 MG/ML IJ SOLN
Freq: Three times a day (TID) | INTRAVENOUS | Status: DC
  Administered 2011-04-23 – 2011-04-25 (×7): via INTRAVENOUS
  Filled 2011-04-23 (×8): qty 3.5

## 2011-04-23 MED ORDER — ACETAMINOPHEN 325 MG PO TABS: 650.0000 mg | ORAL_TABLET | Freq: Four times a day (QID) | ORAL | Status: DC | PRN

## 2011-04-23 MED ORDER — ZOLPIDEM TARTRATE 10 MG PO TABS: 10.0000 mg | ORAL_TABLET | Freq: Every evening | ORAL | Status: DC | PRN

## 2011-04-23 MED ORDER — LACTATED RINGERS IV SOLN
INTRAVENOUS | Status: DC
  Administered 2011-04-23 – 2011-04-24 (×2): via INTRAVENOUS

## 2011-04-23 MED ORDER — ONDANSETRON HCL 4 MG/2ML IJ SOLN: 4.0000 mg | Freq: Four times a day (QID) | INTRAMUSCULAR | Status: DC | PRN

## 2011-04-23 MED ORDER — CLINDAMYCIN PHOSPHATE 900 MG/50ML IV SOLN: 900.0000 mg | Freq: Three times a day (TID) | INTRAVENOUS | Status: DC

## 2011-04-23 MED ORDER — ONDANSETRON 4 MG PO TBDP
4.0000 mg | ORAL_TABLET | Freq: Four times a day (QID) | ORAL | Status: DC | PRN
  Filled 2011-04-23: qty 1

## 2011-04-23 MED ORDER — IBUPROFEN 600 MG PO TABS
600.0000 mg | ORAL_TABLET | Freq: Four times a day (QID) | ORAL | Status: DC
  Administered 2011-04-23 – 2011-04-26 (×13): 600 mg via ORAL
  Filled 2011-04-23 (×13): qty 1

## 2011-04-23 NOTE — Consult Note (Signed)
ANTIBIOTIC CONSULT NOTE - INITIAL  Pharmacy Consult for Gentamicin Indication: Post C-section Wound Infection  Allergies  Allergen Reactions  . Vicodin (Hydrocodone-Acetaminophen) Itching    Patient Measurements: Height: 5\' 3"  (160 cm) Weight: 148 lb (67.132 kg) IBW/kg (Calculated) : 52.4  Adjusted Body Weight: 56.8kg    Labs:  Basename 04/23/11 1805  WBC 22.9*  HGB 10.6*  PLT 459*  LAB[facility]A --  CREATININE --     Medical History: Pt s/p LTCS 04-14-11. Delivered at 27 + weeks.  Medications:  Clindamycin 900mg  IV q8h Assessment: 35yo F re-admitted with purulent wound infection s/p LTCS 04-14-11.  Goal of Therapy:  Gentamicin peaks 6-8 mcg/ml  Trough < 1 mcg/ml  Plan:  1. Gentamicin 140mg  IV q8h (mixed with Clindamycin). 2. Check Screatinine in am. 3. Will continue to follow and check Gentamicin levels as clinically indicated. Thanks! Claybon Jabs 04/23/2011,6:43 PM

## 2011-04-23 NOTE — Progress Notes (Signed)
Patient was a direct admit from the office at 1800. She is alert and oriented. Breathing is regular and unlabored. She is not complaining of any pain. Unable to do a full assessment due to patients need to pump breast milk for her baby.

## 2011-04-24 ENCOUNTER — Encounter (HOSPITAL_COMMUNITY): Payer: Self-pay | Admitting: Obstetrics & Gynecology

## 2011-04-24 DIAGNOSIS — IMO0002 Reserved for concepts with insufficient information to code with codable children: Secondary | ICD-10-CM

## 2011-04-24 DIAGNOSIS — O8600 Infection of obstetric surgical wound, unspecified: Secondary | ICD-10-CM | POA: Diagnosis present

## 2011-04-24 HISTORY — DX: Reserved for concepts with insufficient information to code with codable children: IMO0002

## 2011-04-24 LAB — CREATININE, SERUM
GFR calc Af Amer: >90 mL/min (ref >90)
GFR calc non Af Amer: >90 mL/min (ref >90)

## 2011-04-24 MED ORDER — LACTATED RINGERS IV SOLN: INTRAVENOUS | Status: DC

## 2011-04-24 MED ORDER — OXYCODONE-ACETAMINOPHEN 5-325 MG PO TABS
1.0000 | ORAL_TABLET | ORAL | Status: DC | PRN
  Administered 2011-04-25: 1 via ORAL
  Filled 2011-04-24: qty 1

## 2011-04-24 NOTE — Progress Notes (Signed)
UR Chart review completed.  

## 2011-04-24 NOTE — H&P (Signed)
Monica Hendrix is an 35 y.o. female presents for admission from the office with wound infection.  HPI:  The patient had presented to the office with purulent drainage from the incision.  She was noted to be febrile.  An I&D was performed.     Past Medical History  Diagnosis Date  . No pertinent past medical history     Past Surgical History  Procedure Date  . Cesarean section   . Cesarean section 04/14/2011    Procedure: CESAREAN SECTION;  Surgeon: Monica Cosier, MD;  Location: [facility];  Service: Gynecology;  Laterality: N/Hendrix;  Repeat of baby girl at 2300 APGAR 2/7    History reviewed. No pertinent family history.  Social History:  reports that she has never smoked. She has never used smokeless tobacco. She reports that she does not drink alcohol or use illicit drugs.  Allergies:  Allergies  Allergen Reactions  . Vicodin (Hydrocodone-Acetaminophen) Itching    Prescriptions prior to admission  Medication Sig Dispense Refill  . ibuprofen (ADVIL,MOTRIN) 600 MG tablet Take 600 mg by mouth every 6 (six) hours. Patient takes for pain       . Prenatal Vit-FePoly-FA-DHA (VITAFOL-ONE) 29-1-200 MG CAPS Take 1 tablet by mouth daily.       Marland Kitchen oxyCODONE-acetaminophen (PERCOCET) 5-325 MG per tablet Take 1-2 tablets by mouth every 3 (three) hours as needed (moderate - severe pain).  30 tablet  0  . DISCONTD: ibuprofen (ADVIL,MOTRIN) 600 MG tablet Take 1 tablet (600 mg total) by mouth every 6 (six) hours.  30 tablet  2    Review of Systems  Constitutional: Positive for fever.    Blood pressure 113/74, pulse 96, temperature 98.4 F (36.9 C), temperature source Oral, resp. rate 18, height 5\' 3"  (1.6 m), weight 67.132 kg (148 lb), last menstrual period 09/19/2010, SpO2 99.00%, unknown if currently breastfeeding. Physical Exam  Constitutional: She appears well-developed.  Cardiovascular: Normal rate.   Respiratory: Effort normal and breath sounds normal.  GI:    Skin:       Incision  w/copious purulent drainage.    Results for orders placed during the hospital encounter of 04/23/11 (from the past 24 hour(s))  CBC     Status: Abnormal   Collection Time   04/23/11  6:05 PM      Component Value Range   WBC 22.9 (*) 4.0 - 10.5 (K/uL)   RBC 3.30 (*) 3.87 - 5.11 (MIL/uL)   Hemoglobin 10.6 (*) 12.0 - 15.0 (g/dL)   HCT 16.1 (*) 09.6 - 46.0 (%)   MCV 93.9  78.0 - 100.0 (fL)   MCH 32.1  26.0 - 34.0 (pg)   MCHC 34.2  30.0 - 36.0 (g/dL)   RDW 04.5  40.9 - 81.1 (%)   Platelets 459 (*) 150 - 400 (K/uL)  DIFFERENTIAL     Status: Abnormal   Collection Time   04/23/11  6:05 PM      Component Value Range   Neutrophils Relative 83 (*) 43 - 77 (%)   Lymphocytes Relative 9 (*) 12 - 46 (%)   Monocytes Relative 8  3 - 12 (%)   Eosinophils Relative 0  0 - 5 (%)   Basophils Relative 0  0 - 1 (%)   Neutro Abs 19.0 (*) 1.7 - 7.7 (K/uL)   Lymphs Abs 2.1  0.7 - 4.0 (K/uL)   Monocytes Absolute 1.8 (*) 0.1 - 1.0 (K/uL)   Eosinophils Absolute 0.0  0.0 - 0.7 (K/uL)  Basophils Absolute 0.0  0.0 - 0.1 (K/uL)   Smear Review LARGE PLATELETS PRESENT    COMPREHENSIVE METABOLIC PANEL     Status: Abnormal   Collection Time   04/23/11  6:05 PM      Component Value Range   Sodium 136  135 - 145 (mEq/L)   Potassium 3.6  3.5 - 5.1 (mEq/L)   Chloride 101  96 - 112 (mEq/L)   CO2 25  19 - 32 (mEq/L)   Glucose, Bld 103 (*) 70 - 99 (mg/dL)   BUN 11  6 - 23 (mg/dL)   Creatinine, Ser 4.54  0.50 - 1.10 (mg/dL)   Calcium 9.1  8.4 - 09.8 (mg/dL)   Total Protein 6.7  6.0 - 8.3 (g/dL)   Albumin 2.6 (*) 3.5 - 5.2 (g/dL)   AST 14  0 - 37 (U/L)   ALT 11  0 - 35 (U/L)   Alkaline Phosphatase 105  39 - 117 (U/L)   Total Bilirubin 0.3  0.3 - 1.2 (mg/dL)   GFR calc non Af Amer >90  >90 (mL/min)   GFR calc Af Amer >90  >90 (mL/min)  CREATININE, SERUM     Status: Normal   Collection Time   04/24/11  5:20 AM      Component Value Range   Creatinine, Ser 0.55  0.50 - 1.10 (mg/dL)   GFR calc non Af Amer >90  >90  (mL/min)   GFR calc Af Amer >90  >90 (mL/min)    No results found.  Assessment/Plan: 35 y.o. w/Hendrix post cesarean section wound infection/systemic signs, symptoms  Admit Antibiotics  Wound care  MonicaGriffin Gerrard Hendrix 04/24/2011, 12:38 PM

## 2011-04-24 NOTE — Progress Notes (Signed)
  Subjective: PO s/p Cesarean Delivery. Now w/wound infection No new complaints  Objective: Vital signs in last 24 hours: BP 113/74  Pulse 96  Temp(Src) 98.4 F (36.9 C) (Oral)  Resp 18  Ht 5\' 3"  (1.6 m)  Wt 67.132 kg (148 lb)  BMI 26.22 kg/m2  SpO2 99%  LMP 09/19/2010  Breastfeeding? Unknown   Total I/O In: -  Out: 500 [Urine:500]   Physical Exam:  General: alert CV: Regular rate and rhythm Resp: clear Abdomen: Copious, purulent drainage.  Induration, erythema superiorly.  The packing was removed.  The incision was opened the entire length.  The incision was probed--the fascia was intact.  The wound was copiously irrigated.  Packing was placed. Ext: extremities normal, atraumatic, no cyanosis or edema    Basename 04/23/11 1805  HGB 10.6*  HCT 31.0*      Assessment/Plan: 35 y.o.  status post Cesarean section.  Wound infection.  Stable Continue antibiotics, wound care ?candidate for a wound vac  JACKSON-MOORE,Kenneth Lax A 04/24/2011, 12:23 PM

## 2011-04-25 LAB — CBC
Hemoglobin: 10.6 g/dL — ABNORMAL LOW (ref 12.0–15.0)
MCHC: 34.1 g/dL (ref 30.0–36.0)
RBC: 3.29 MIL/uL — ABNORMAL LOW (ref 3.87–5.11)

## 2011-04-25 LAB — DIFFERENTIAL
Basophils Absolute: 0 10*3/uL (ref 0.0–0.1)
Basophils Relative: 0 % (ref 0–1)
Eosinophils Relative: 2 % (ref 0–5)
Lymphocytes Relative: 20 % (ref 12–46)
Lymphs Abs: 2.7 10*3/uL (ref 0.7–4.0)
Neutro Abs: 10 10*3/uL — ABNORMAL HIGH (ref 1.7–7.7)
Neutrophils Relative %: 75 % (ref 43–77)
Promyelocytes Absolute: 0 %

## 2011-04-25 MED ORDER — PIPERACILLIN-TAZOBACTAM 3.375 G IVPB
3.3750 g | Freq: Three times a day (TID) | INTRAVENOUS | Status: DC
  Administered 2011-04-25 – 2011-04-26 (×2): 3.375 g via INTRAVENOUS
  Filled 2011-04-25 (×5): qty 50

## 2011-04-25 NOTE — Progress Notes (Signed)
  Subjective: PO s/p Cesarean Delivery. Wound infection No new complaints Objective: Vital signs in last 24 hours: BP 114/80  Pulse 91  Temp(Src) 98.7 F (37.1 C) (Oral)  Resp 18  Ht 5\' 3"  (1.6 m)  Wt 67.132 kg (148 lb)  BMI 26.22 kg/m2  SpO2 100%  LMP 09/19/2010  Breastfeeding? Unknown   Total I/O In: -  Out: 600 [Urine:600]   Physical Exam:  General: alert CV: Regular rate and rhythm Resp: clear Abdomen: Less erythema, induration Ext: extremities normal, atraumatic, no cyanosis or edema    Basename 04/25/11 1245 04/23/11 1805  HGB 10.6* 10.6*  HCT 31.1* 31.0*      Assessment/Plan: 35 y.o.  status post Cesarean section.  Wound infection. Stable Continue antibiotics, dressing changes [facility] referral Re-check WBC JACKSON-MOORE,Andros Channing A 04/25/2011, 1:23 PM

## 2011-04-26 MED ORDER — OXYCODONE-ACETAMINOPHEN 5-325 MG PO TABS
1.0000 | ORAL_TABLET | ORAL | Status: AC | PRN
Start: 2011-04-26 — End: 2011-05-06

## 2011-04-26 MED ORDER — AMOXICILLIN-POT CLAVULANATE 875-125 MG PO TABS: 1.0000 | ORAL_TABLET | Freq: Two times a day (BID) | ORAL | Status: AC

## 2011-04-26 NOTE — Discharge Summary (Signed)
  Physician Discharge Summary  Patient ID: Monica Hendrix MRN: [id number] DOB/AGE: 1976/05/15 35 y.o.  Admit date: 04/23/2011 Discharge date: 04/26/2011  Admission Diagnoses:  Discharge Diagnoses:  Active Problems:  Wound infection following cesarean section, postpartum   Discharged Condition: stable  Hospital Course: The patient was started on broad spectrum antibiotics, parenterally.  Dressing changes were performed twice daily.  She remained afebrile.  An initial leukocytosis improved.  Consults: none  Significant Diagnostic Studies: None  Treatments: antibiotics: Zosyn, gentamycin and Clindamycin  Discharge Exam: Blood pressure 99/57, pulse 87, temperature 98.2 F (36.8 C), temperature source Oral, resp. rate 18, height 5\' 3"  (1.6 m), weight 67.132 kg (148 lb), last menstrual period 09/19/2010, SpO2 99.00%, unknown if currently breastfeeding. General appearance: alert GI: soft, non-tender; bowel sounds normal; no masses,  no organomegaly and Incision w/induration, granulating in  Disposition: Home, [facility] referral for dressing changes  Discharge Orders    Future Orders Please Complete By Expires   Diet - low sodium heart healthy      Increase activity slowly      Discharge wound care:      Comments:   Wet-->dry dressing changes twice/day     Current Discharge Medication List    START taking these medications   Details  amoxicillin-clavulanate (AUGMENTIN) 875-125 MG per tablet Take 1 tablet by mouth 2 (two) times daily. Qty: 20 tablet, Refills: 0      CONTINUE these medications which have CHANGED   Details  oxyCODONE-acetaminophen (PERCOCET) 5-325 MG per tablet Take 1-2 tablets by mouth every 3 (three) hours as needed (moderate - severe pain). Qty: 60 tablet, Refills: 0      CONTINUE these medications which have NOT CHANGED   Details  ibuprofen (ADVIL,MOTRIN) 600 MG tablet Take 600 mg by mouth every 6 (six) hours. Patient takes for pain       Prenatal Vit-FePoly-FA-DHA (VITAFOL-ONE) 29-1-200 MG CAPS Take 1 tablet by mouth daily.        Follow-up Information    Follow up with Roseanna Rainbow, MD on 05/03/2011. (At 10:30 am)    Contact information:   [address] [place] [address] [phone]          Signed: Roseanna Rainbow 04/26/2011, 10:35 AM

## 2011-04-26 NOTE — Progress Notes (Signed)
Subjective: PO s/p C/D w/wound infection.  No complaints  Objective: Vital signs in last 24 hours: BP 99/57  Pulse 87  Temp(Src) 98.2 F (36.8 C) (Oral)  Resp 18  Ht 5\' 3"  (1.6 m)  Wt 67.132 kg (148 lb)  BMI 26.22 kg/m2  SpO2 99%  LMP 09/19/2010  Breastfeeding? Unknown  Physical Exam:  General: alert CV: Regular rate and rhythm Resp: clear Abdomen: soft, nontender, normal bowel sounds Lochia: minimal  Incision: indurated, granulating in Ext: extremities normal, atraumatic, no cyanosis or edema  Basename 04/25/11 1245 04/23/11 1805  HGB 10.6* 10.6*  HCT 31.1* 31.0*    Assessment/Plan: 35 y.o. w/wound infection s/p C/D Doing well  D/C home Referral made to [facility] for dressing changes  Monica Hendrix,Monica Hendrix A 04/26/2011, 10:26 AM

## 2012-04-15 DEATH — deceased

## 2013-06-17 ENCOUNTER — Ambulatory Visit: Payer: Self-pay | Admitting: Obstetrics & Gynecology

## 2014-05-17 ENCOUNTER — Encounter (HOSPITAL_COMMUNITY): Payer: Self-pay | Admitting: Obstetrics & Gynecology

## 2014-07-13 ENCOUNTER — Encounter: Payer: Self-pay | Admitting: Obstetrics & Gynecology

## 2017-04-04 ENCOUNTER — Other Ambulatory Visit: Payer: Self-pay | Admitting: Family

## 2017-04-04 DIAGNOSIS — R102 Pelvic and perineal pain: Secondary | ICD-10-CM

## 2017-04-05 ENCOUNTER — Other Ambulatory Visit: Payer: Self-pay | Admitting: Family

## 2017-04-05 DIAGNOSIS — R102 Pelvic and perineal pain: Secondary | ICD-10-CM

## 2017-04-09 ENCOUNTER — Ambulatory Visit
Admission: RE | Admit: 2017-04-09 | Discharge: 2017-04-09 | Disposition: A | Discharge status: Home / Self Care | Payer: BLUE CROSS/BLUE SHIELD | Source: Ambulatory Visit | Attending: Family | Admitting: Family

## 2017-04-09 DIAGNOSIS — R102 Pelvic and perineal pain: Secondary | ICD-10-CM

## 2017-05-02 DIAGNOSIS — D259 Leiomyoma of uterus, unspecified: Secondary | ICD-10-CM | POA: Insufficient documentation

## 2017-08-08 ENCOUNTER — Other Ambulatory Visit: Payer: Self-pay

## 2017-08-08 ENCOUNTER — Ambulatory Visit (INDEPENDENT_AMBULATORY_CARE_PROVIDER_SITE_OTHER): Payer: No Typology Code available for payment source | Admitting: Family Medicine

## 2017-08-08 ENCOUNTER — Encounter: Payer: Self-pay | Admitting: Family Medicine

## 2017-08-08 VITALS — BP 132/89 | HR 139 | Temp 100.8°F | Resp 16 | Ht 63.0 in | Wt 160.0 lb

## 2017-08-08 DIAGNOSIS — R05 Cough: Secondary | ICD-10-CM | POA: Diagnosis not present

## 2017-08-08 DIAGNOSIS — R6889 Other general symptoms and signs: Secondary | ICD-10-CM

## 2017-08-08 DIAGNOSIS — R509 Fever, unspecified: Secondary | ICD-10-CM

## 2017-08-08 DIAGNOSIS — R058 Other specified cough: Secondary | ICD-10-CM

## 2017-08-08 DIAGNOSIS — J069 Acute upper respiratory infection, unspecified: Secondary | ICD-10-CM | POA: Diagnosis not present

## 2017-08-08 LAB — POC INFLUENZA A&B (BINAX/QUICKVUE)
INFLUENZA B, POC: NEGATIVE
Influenza A, POC: NEGATIVE

## 2017-08-08 MED ORDER — AMOXICILLIN-POT CLAVULANATE 875-125 MG PO TABS: 1.0000 | ORAL_TABLET | Freq: Two times a day (BID) | ORAL | 0 refills | Status: DC

## 2017-08-08 MED ORDER — ACETAMINOPHEN 500 MG PO TABS
1000.0000 mg | ORAL_TABLET | Freq: Once | ORAL | Status: DC
Start: 2017-08-08 — End: 2017-08-09

## 2017-08-08 MED ORDER — FLUTICASONE PROPIONATE 50 MCG/ACT NA SUSP: 2.0000 | Freq: Every day | NASAL | 6 refills | Status: DC

## 2017-08-08 MED ORDER — ONDANSETRON HCL 4 MG PO TABS: 4.0000 mg | ORAL_TABLET | Freq: Three times a day (TID) | ORAL | 0 refills | Status: DC | PRN

## 2017-08-08 MED ORDER — GUAIFENESIN ER 600 MG PO TB12: 600.0000 mg | ORAL_TABLET | Freq: Two times a day (BID) | ORAL | 0 refills | Status: DC

## 2017-08-08 NOTE — Progress Notes (Signed)
Chief Complaint  Patient presents with  . flu like symptoms    onset last night chills that have gotten progressively worse, 102 temp at 2 pm today, bodyaches, chills and ha.  Tylenol cold last night but no medication today    HPI   Pt reports that she had a fever with some yellow blood tinged mucus She had tmax 102 at 2pm today Onset of symptoms was 24 hours ago She states that overnight she took  Tylenol She states that she took the flu shot at home She also had some body aches and fever She feels like she had headache and chest congestion     Past Medical History:  Diagnosis Date  . Fibroids   . No pertinent past medical history     Current Outpatient Medications  Medication Sig Dispense Refill  . amoxicillin-clavulanate (AUGMENTIN) 875-125 MG tablet Take 1 tablet by mouth 2 (two) times daily. 20 tablet 0  . fluticasone (FLONASE) 50 MCG/ACT nasal spray Place 2 sprays into both nostrils daily. 16 g 6  . guaiFENesin (MUCINEX) 600 MG 12 hr tablet Take 1 tablet (600 mg total) by mouth 2 (two) times daily. 30 tablet 0  . ondansetron (ZOFRAN) 4 MG tablet Take 1 tablet (4 mg total) by mouth every 8 (eight) hours as needed for nausea or vomiting. 20 tablet 0  . Prenatal Vit-FePoly-FA-DHA (VITAFOL-ONE) 29-1-200 MG CAPS Take 1 tablet by mouth daily.      Current Facility-Administered Medications  Medication Dose Route Frequency Provider Last Rate Last Dose  . acetaminophen (TYLENOL) tablet 1,000 mg  1,000 mg Oral Once Forrest Moron, MD        Allergies:  Allergies  Allergen Reactions  . Macrobid [Nitrofurantoin Macrocrystal] Hives  . Vicodin [Hydrocodone-Acetaminophen] Itching    Past Surgical History:  Procedure Laterality Date  . CESAREAN SECTION    . CESAREAN SECTION  04/14/2011   Procedure: CESAREAN SECTION;  Surgeon: Frederico Hamman, MD;  Location: [facility];  Service: Gynecology;  Laterality: N/A;  Repeat of baby girl at 65 APGAR 2/7    Social History    Socioeconomic History  . Marital status: Married    Spouse name: None  . Number of children: None  . Years of education: None  . Highest education level: None  Social Needs  . Financial resource strain: None  . Food insecurity - worry: None  . Food insecurity - inability: None  . Transportation needs - medical: None  . Transportation needs - non-medical: None  Occupational History  . None  Tobacco Use  . Smoking status: Never Smoker  . Smokeless tobacco: Never Used  Substance and Sexual Activity  . Alcohol use: Yes    Alcohol/week: 0.6 oz    Types: 1 Glasses of wine per week    Comment: SOCIALLY  . Drug use: No  . Sexual activity: Yes    Birth control/protection: None  Other Topics Concern  . None  Social History Narrative  . None    Family History  Problem Relation Age of Onset  . Diabetes Mother   . Hypertension Mother   . Cancer Father   . Diabetes Father      ROS See hpi   Objective: Vitals:   08/08/17 1654  BP: 132/89  Pulse: (!) 139  Resp: 16  Temp: (!) 100.8 F (38.2 C)  TempSrc: Oral  SpO2: 99%  Weight: 160 lb (72.6 kg)  Height: 5\' 3"  (1.6 m)    Physical Exam  General: alert, oriented, in NAD Head: normocephalic, atraumatic,s +bilateral sinus tenderness Eyes: EOM intact, no scleral icterus or conjunctival injection Ears: TM clear bilaterally Nose: mucosa nonerythematous, nonedematous Throat: no pharyngeal exudate or erythema Lymph: no posterior auricular, submental or cervical lymph adenopathy Heart: normal rate, normal sinus rhythm, no murmurs Lungs: clear to auscultation bilaterally, no wheezing   Assessment and Plan Sequita was seen today for flu like symptoms.  Diagnoses and all orders for this visit:  Flu-like symptoms- symptoms less than 24 hours thus it might be in a window when the POC flu is neg Gave droplet precautions Pt received flu vaccine -     POC Influenza A&B(BINAX/QUICKVUE)  Fever chills- continue tylenol at  home -     acetaminophen (TYLENOL) tablet 1,000 mg  Productive cough- mucinex  Acute URI -  Supportive care with flonase, theraflu and zofran as well as mucinex Gave abx as she seems to have early sinus infection   Other orders -     fluticasone (FLONASE) 50 MCG/ACT nasal spray; Place 2 sprays into both nostrils daily. -     amoxicillin-clavulanate (AUGMENTIN) 875-125 MG tablet; Take 1 tablet by mouth 2 (two) times daily. -     ondansetron (ZOFRAN) 4 MG tablet; Take 1 tablet (4 mg total) by mouth every 8 (eight) hours as needed for nausea or vomiting. -     guaiFENesin (MUCINEX) 600 MG 12 hr tablet; Take 1 tablet (600 mg total) by mouth 2 (two) times daily.     [place]

## 2017-08-08 NOTE — Patient Instructions (Addendum)
   Monica Hendrix Med sinus rinse  IF you received an x-ray today, you will receive an invoice from [facility]. Please contact [facility] at [phone] with questions or concerns regarding your invoice.   IF you received labwork today, you will receive an invoice from [facility]. Please contact [facility] at [phone] with questions or concerns regarding your invoice.   Our billing staff will not be able to assist you with questions regarding bills from these companies.  You will be contacted with the lab results as soon as they are available. The fastest way to get your results is to activate your My Chart account. Instructions are located on the last page of this paperwork. If you have not heard from [place] regarding the results in 2 weeks, please contact this office.    Sinus Rinse What is a sinus rinse? A sinus rinse is a simple home treatment that is used to rinse your sinuses with a sterile mixture of salt and water (saline solution). Sinuses are air-filled spaces in your skull behind the bones of your face and forehead that open into your nasal cavity. You will use the following:  Saline solution.  Neti pot or spray bottle. This releases the saline solution into your nose and through your sinuses. Neti pots and spray bottles can be purchased at [employer], a health food store, or online.  When would I do a sinus rinse? A sinus rinse can help to clear mucus, dirt, dust, or pollen from the nasal cavity. You may do a sinus rinse when you have a cold, a virus, nasal allergy symptoms, a sinus infection, or stuffiness in the nose or sinuses. If you are considering a sinus rinse:  Ask your child's health care provider before performing a sinus rinse on your child.  Do not do a sinus rinse if you have had ear or nasal surgery, ear infection, or blocked ears.  How do I do a sinus rinse?  Wash your hands.  Disinfect your device according to the directions provided and  then dry it.  Use the solution that comes with your device or one that is sold separately in stores. Follow the mixing directions on the package.  Fill your device with the amount of saline solution as directed by the device instructions.  Stand over a sink and tilt your head sideways over the sink.  Place the spout of the device in your upper nostril (the one closer to the ceiling).  Gently pour or squeeze the saline solution into the nasal cavity. The liquid should drain to the lower nostril if you are not overly congested.  Gently blow your nose. Blowing too hard may cause ear pain.  Repeat in the other nostril.  Clean and rinse your device with clean water and then air-dry it. Are there risks of a sinus rinse? Sinus rinse is generally very safe and effective. However, there are a few risks, which include:  A burning sensation in the sinuses. This may happen if you do not make the saline solution as directed. Make sure to follow all directions when making the saline solution.  Infection from contaminated water. This is rare, but possible.  Nasal irritation.  This information is not intended to replace advice given to you by your health care provider. Make sure you discuss any questions you have with your health care provider. Document Released: 01/27/2014 Document Revised: 05/29/2016 Document Reviewed: 11/17/2013 Elsevier Interactive Patient Education  2017 [employer].

## 2017-08-12 ENCOUNTER — Telehealth: Payer: Self-pay | Admitting: Family Medicine

## 2017-08-12 NOTE — Telephone Encounter (Signed)
Copied from [place]. Topic: General - Other >> Aug 12, 2017  8:43 AM Patrice Paradise wrote: Reason for CRM: Patient called to see it Dr. Nolon Rod could call her in another antibiotic because the amoxicillin-clavulanate (AUGMENTIN) 875-125 MG is tearing up her stomach. Patient can be reached @ [phone].  [facility] ([address] [place] [address] [place] [address] Phone: [phone] Fax: [phone]

## 2017-08-12 NOTE — Telephone Encounter (Signed)
Patient called and wanted the new medication sent to [facility]

## 2017-08-12 NOTE — Telephone Encounter (Signed)
Pt request that new  Anti  Biotic  If  prescribed  Be   Sent to  [facility]

## 2017-08-12 NOTE — Telephone Encounter (Signed)
Pt  Requesting   Change  Of  Anti biotics   Due  To  Side  Affects    LOV  08-08-2017      LAST  FILL   08-08-2017     PHARMACY   [facility]  [address]    [place]

## 2017-08-14 ENCOUNTER — Telehealth: Payer: Self-pay

## 2017-08-14 MED ORDER — AZITHROMYCIN 250 MG PO TABS: ORAL_TABLET | ORAL | 0 refills | Status: DC

## 2017-08-14 NOTE — Telephone Encounter (Signed)
Copied from [place]. Topic: General - Other >> Aug 12, 2017  8:43 AM Patrice Paradise wrote: Reason for CRM: Patient called to see it Dr. Nolon Rod could call her in another antibiotic because the amoxicillin-clavulanate (AUGMENTIN) 875-125 MG is tearing up her stomach. Patient can be reached @ [phone].  [facility] ([address]), [place] [address] [place] [address] Phone: [phone] Fax: [phone]    >> Aug 14, 2017  2:29 PM Percell Belt A wrote: Pt would like a call back today.  She is wanting to know if this meds has been changed.  She said that she is still not feeling good and needs meds asap   Best number [phone]

## 2017-08-14 NOTE — Addendum Note (Signed)
Addended by: Delia Chimes A on: 08/14/2017 06:09 PM   Modules accepted: Orders

## 2017-10-24 ENCOUNTER — Encounter: Payer: Self-pay | Admitting: Family Medicine

## 2017-10-24 ENCOUNTER — Ambulatory Visit (INDEPENDENT_AMBULATORY_CARE_PROVIDER_SITE_OTHER): Payer: No Typology Code available for payment source | Admitting: Family Medicine

## 2017-10-24 ENCOUNTER — Other Ambulatory Visit: Payer: Self-pay

## 2017-10-24 VITALS — BP 124/83 | HR 98 | Temp 99.2°F | Resp 17 | Ht 63.0 in | Wt 159.2 lb

## 2017-10-24 DIAGNOSIS — Z131 Encounter for screening for diabetes mellitus: Secondary | ICD-10-CM

## 2017-10-24 DIAGNOSIS — N6012 Diffuse cystic mastopathy of left breast: Secondary | ICD-10-CM | POA: Diagnosis not present

## 2017-10-24 DIAGNOSIS — Z13220 Encounter for screening for lipoid disorders: Secondary | ICD-10-CM

## 2017-10-24 DIAGNOSIS — N6011 Diffuse cystic mastopathy of right breast: Secondary | ICD-10-CM

## 2017-10-24 DIAGNOSIS — Z124 Encounter for screening for malignant neoplasm of cervix: Secondary | ICD-10-CM

## 2017-10-24 DIAGNOSIS — Z833 Family history of diabetes mellitus: Secondary | ICD-10-CM

## 2017-10-24 DIAGNOSIS — Z0000 Encounter for general adult medical examination without abnormal findings: Secondary | ICD-10-CM

## 2017-10-24 DIAGNOSIS — Z130 Encounter for screening for diseases of the blood and blood-forming organs and certain disorders involving the immune mechanism: Secondary | ICD-10-CM | POA: Diagnosis not present

## 2017-10-24 LAB — HM PAP SMEAR

## 2017-10-24 NOTE — Progress Notes (Signed)
Chief Complaint  Patient presents with  . Annual Exam    Subjective:  Monica Hendrix is a 42 y.o. female here for a health maintenance visit.  Patient is established pt  Patient Active Problem List   Diagnosis Date Noted  . Uterine leiomyoma 05/02/2017    Past Medical History:  Diagnosis Date  . Fibroids   . No pertinent past medical history     Past Surgical History:  Procedure Laterality Date  . CESAREAN SECTION    . CESAREAN SECTION  04/14/2011   Procedure: CESAREAN SECTION;  Surgeon: Frederico Hamman, MD;  Location: [facility];  Service: Gynecology;  Laterality: N/A;  Repeat of baby girl at 2300 APGAR 2/7     Outpatient Medications Prior to Visit  Medication Sig Dispense Refill  . azithromycin (ZITHROMAX) 250 MG tablet Take 2 tabs on day 1 then one each day after 6 tablet 0  . fluticasone (FLONASE) 50 MCG/ACT nasal spray Place 2 sprays into both nostrils daily. 16 g 6  . guaiFENesin (MUCINEX) 600 MG 12 hr tablet Take 1 tablet (600 mg total) by mouth 2 (two) times daily. 30 tablet 0  . ondansetron (ZOFRAN) 4 MG tablet Take 1 tablet (4 mg total) by mouth every 8 (eight) hours as needed for nausea or vomiting. 20 tablet 0  . Prenatal Vit-FePoly-FA-DHA (VITAFOL-ONE) 29-1-200 MG CAPS Take 1 tablet by mouth daily.      No facility-administered medications prior to visit.     Allergies  Allergen Reactions  . Macrobid [Nitrofurantoin Macrocrystal] Hives  . Vicodin [Hydrocodone-Acetaminophen] Itching     Family History  Problem Relation Age of Onset  . Diabetes Mother   . Hypertension Mother   . Cancer Father   . Diabetes Father      Health Habits: Dental Exam: up to date Eye Exam: up to date Exercise: 0 times/week on average Current exercise activities: walking at work Diet: balanced  Social History   Socioeconomic History  . Marital status: Married    Spouse name: Not on file  . Number of children: Not on file  . Years of education: Not on file  . Highest  education level: Not on file  Occupational History  . Not on file  Social Needs  . Financial resource strain: Not on file  . Food insecurity:    Worry: Not on file    Inability: Not on file  . Transportation needs:    Medical: Not on file    Non-medical: Not on file  Tobacco Use  . Smoking status: Never Smoker  . Smokeless tobacco: Never Used  Substance and Sexual Activity  . Alcohol use: Yes    Alcohol/week: 0.6 oz    Types: 1 Glasses of wine per week    Comment: SOCIALLY  . Drug use: No  . Sexual activity: Yes    Birth control/protection: None  Lifestyle  . Physical activity:    Days per week: Not on file    Minutes per session: Not on file  . Stress: Not on file  Relationships  . Social connections:    Talks on phone: Not on file    Gets together: Not on file    Attends religious service: Not on file    Active member of club or organization: Not on file    Attends meetings of clubs or organizations: Not on file    Relationship status: Not on file  . Intimate partner violence:    Fear of current or ex  partner: Not on file    Emotionally abused: Not on file    Physically abused: Not on file    Forced sexual activity: Not on file  Other Topics Concern  . Not on file  Social History Narrative  . Not on file   Social History   Substance and Sexual Activity  Alcohol Use Yes  . Alcohol/week: 0.6 oz  . Types: 1 Glasses of wine per week   Comment: SOCIALLY   Social History   Tobacco Use  Smoking Status Never Smoker  Smokeless Tobacco Never Used   Social History   Substance and Sexual Activity  Drug Use No    GYN: Sexual Health Menstrual status: regular menses LMP: Patient's last menstrual period was 10/02/2017. Last pap smear: see HM section History of abnormal pap smears:  Sexually active: with female partner Current contraception: none (would not mind becoming pregnant)  Health Maintenance: See under health Maintenance activity for review of  completion dates as well. Immunization History  Administered Date(s) Administered  . Influenza,inj,Quad PF,6+ Mos 03/23/2013  . Influenza,inj,quad, With Preservative 06/26/2014  . Influenza-Unspecified 04/04/2017  . Tdap 04/16/2011      Depression Screen-PHQ2/9 Depression screen [facility] 2/9 10/24/2017 08/08/2017  Decreased Interest 0 0  Down, Depressed, Hopeless 0 0  PHQ - 2 Score 0 0       Depression Severity and Treatment Recommendations:  0-4= None  5-9= Mild / Treatment: Support, educate to call if worse; return in one month  10-14= Moderate / Treatment: Support, watchful waiting; Antidepressant or Psycotherapy  15-19= Moderately severe / Treatment: Antidepressant OR Psychotherapy  >= 20 = Major depression, severe / Antidepressant AND Psychotherapy    Review of Systems   Review of Systems  Constitutional: Negative for chills and fever.  HENT: Negative for congestion, hearing loss and sinus pain.   Respiratory: Negative for cough, shortness of breath and wheezing.   Cardiovascular: Negative for chest pain and palpitations.  Gastrointestinal: Negative for abdominal pain, nausea and vomiting.  Genitourinary: Negative for dysuria and urgency.  Musculoskeletal: Negative for back pain and neck pain.  Skin: Negative for itching and rash.  Neurological: Negative for dizziness, tingling and headaches.  Psychiatric/Behavioral: Negative for depression and hallucinations. The patient is not nervous/anxious.     See HPI for ROS as well.    Objective:   Vitals:   10/24/17 0814  BP: 124/83  Pulse: 98  Resp: 17  Temp: 99.2 F (37.3 C)  TempSrc: Oral  SpO2: 96%  Weight: 159 lb 3.2 oz (72.2 kg)  Height: _0  (1.6 m)    Body mass index is 28.2 kg/m.  Physical Exam  BP 124/83 (BP Location: Left Arm, Patient Position: Sitting, Cuff Size: Normal)   Pulse 98   Temp 99.2 F (37.3 C) (Oral)   Resp 17   Ht _1  (1.6 m)   Wt 159 lb 3.2 oz (72.2 kg)   LMP 10/02/2017    SpO2 96%   BMI 28.20 kg/m   General Appearance:    Alert, cooperative, no distress, appears stated age  Head:    Normocephalic, without obvious abnormality, atraumatic  Eyes:    PERRL, conjunctiva/corneas clear, EOM's intact, fundi    benign, both eyes  Ears:    Normal TM's and external ear canals, both ears  Nose:   Nares normal, septum midline, mucosa normal, no drainage    or sinus tenderness  Throat:   Lips, mucosa, and tongue normal; teeth and gums normal  Neck:  Supple, symmetrical, trachea midline, no adenopathy;    thyroid:  no enlargement/tenderness/nodules; no carotid   bruit or JVD  Back:     Symmetric, no curvature, ROM normal, no CVA tenderness  Lungs:     Clear to auscultation bilaterally, respirations unlabored  Chest Wall:    No tenderness or deformity   Heart:    Regular rate and rhythm, S1 and S2 normal, no murmur, rub   or gallop  Breast Exam:    No tenderness, masses, or nipple abnormality  Abdomen:     Soft, non-tender, bowel sounds active all four quadrants,    no masses, no organomegaly  Genitalia:    Chaperone present. Pap smear performed. Bimanual exam performed with palpable fibroid. Normal female without lesion, discharge or tenderness  Extremities:   Extremities normal, atraumatic, no cyanosis or edema  Pulses:   2+ and symmetric all extremities  Skin:   Skin color, texture, turgor normal, no rashes or lesions  Lymph nodes:   Cervical, supraclavicular, and axillary nodes normal  Neurologic:   CNII-XII intact, normal strength, sensation and reflexes    throughout      Assessment/Plan:   Patient was seen for a health maintenance exam.  Counseled the patient on health maintenance issues. Reviewed her health mainteance schedule and ordered appropriate tests (see orders.) Counseled on regular exercise and weight management. Recommend regular eye exams and dental cleaning.   The following issues were addressed today for health maintenance:   Aryah was  seen today for annual exam.  Diagnoses and all orders for this visit:  Encounter for health maintenance examination in adult- discussed age appropriate screenings  Papanicolaou smear for cervical cancer screening- discussed pap smear guidelines -     Pap IG, CT/NG NAA, and HPV (high risk) [facility]  Fibrocystic breast changes, bilateral- discussed mammograms  Screening, anemia, deficiency, iron -     CBC  Family history of diabetes mellitus (DM) -     Hemoglobin A1c  Screening, lipid -     Lipid panel -     TSH -     Comprehensive metabolic panel  Screening for diabetes mellitus -     Hemoglobin A1c     Body mass index is 28.2 kg/m.:  Discussed the patient's BMI with patient. The BMI body mass index is 28.2 kg/m.     No future appointments.  Patient Instructions       IF you received an x-ray today, you will receive an invoice from [facility]. Please contact [facility] at [phone] with questions or concerns regarding your invoice.   IF you received labwork today, you will receive an invoice from [facility]. Please contact [facility] at [phone] with questions or concerns regarding your invoice.   Our billing staff will not be able to assist you with questions regarding bills from these companies.  You will be contacted with the lab results as soon as they are available. The fastest way to get your results is to activate your My Chart account. Instructions are located on the last page of this paperwork. If you have not heard from [place] regarding the results in 2 weeks, please contact this office.    Health Maintenance, Female Adopting a healthy lifestyle and getting preventive care can go a long way to promote health and wellness. Talk with your health care provider about what schedule of regular examinations is right for you. This is a good chance for you to check in with your provider about disease  prevention and staying healthy. In  between checkups, there are plenty of things you can do on your own. Experts have done a lot of research about which lifestyle changes and preventive measures are most likely to keep you healthy. Ask your health care provider for more information. Weight and diet Eat a healthy diet  Be sure to include plenty of vegetables, fruits, low-fat dairy products, and lean protein.  Do not eat a lot of foods high in solid fats, added sugars, or salt.  Get regular exercise. This is one of the most important things you can do for your health. ? Most adults should exercise for at least 150 minutes each week. The exercise should increase your heart rate and make you sweat (moderate-intensity exercise). ? Most adults should also do strengthening exercises at least twice a week. This is in addition to the moderate-intensity exercise.  Maintain a healthy weight  Body mass index (BMI) is a measurement that can be used to identify possible weight problems. It estimates body fat based on height and weight. Your health care provider can help determine your BMI and help you achieve or maintain a healthy weight.  For females 17 years of age and older: ? A BMI below 18.5 is considered underweight. ? A BMI of 18.5 to 24.9 is normal. ? A BMI of 25 to 29.9 is considered overweight. ? A BMI of 30 and above is considered obese.  Watch levels of cholesterol and blood lipids  You should start having your blood tested for lipids and cholesterol at 43 years of age, then have this test every 5 years.  You may need to have your cholesterol levels checked more often if: ? Your lipid or cholesterol levels are high. ? You are older than 43 years of age. ? You are at high risk for heart disease.  Cancer screening Lung Cancer  Lung cancer screening is recommended for adults 64-47 years old who are at high risk for lung cancer because of a history of smoking.  A yearly low-dose CT scan of the lungs is recommended for  people who: ? Currently smoke. ? Have quit within the past 15 years. ? Have at least a 30-pack-year history of smoking. A pack year is smoking an average of one pack of cigarettes a day for 1 year.  Yearly screening should continue until it has been 15 years since you quit.  Yearly screening should stop if you develop a health problem that would prevent you from having lung cancer treatment.  Breast Cancer  Practice breast self-awareness. This means understanding how your breasts normally appear and feel.  It also means doing regular breast self-exams. Let your health care provider know about any changes, no matter how small.  If you are in your 20s or 30s, you should have a clinical breast exam (CBE) by a health care provider every 1-3 years as part of a regular health exam.  If you are 76 or older, have a CBE every year. Also consider having a breast X-ray (mammogram) every year.  If you have a family history of breast cancer, talk to your health care provider about genetic screening.  If you are at high risk for breast cancer, talk to your health care provider about having an MRI and a mammogram every year.  Breast cancer gene (BRCA) assessment is recommended for women who have family members with BRCA-related cancers. BRCA-related cancers include: ? Breast. ? Ovarian. ? Tubal. ? Peritoneal cancers.  Results of the  assessment will determine the need for genetic counseling and BRCA1 and BRCA2 testing.  Cervical Cancer Your health care provider may recommend that you be screened regularly for cancer of the pelvic organs (ovaries, uterus, and vagina). This screening involves a pelvic examination, including checking for microscopic changes to the surface of your cervix (Pap test). You may be encouraged to have this screening done every 3 years, beginning at age 54.  For women ages 54-65, health care providers may recommend pelvic exams and Pap testing every 3 years, or they may  recommend the Pap and pelvic exam, combined with testing for human papilloma virus (HPV), every 5 years. Some types of HPV increase your risk of cervical cancer. Testing for HPV may also be done on women of any age with unclear Pap test results.  Other health care providers may not recommend any screening for nonpregnant women who are considered low risk for pelvic cancer and who do not have symptoms. Ask your health care provider if a screening pelvic exam is right for you.  If you have had past treatment for cervical cancer or a condition that could lead to cancer, you need Pap tests and screening for cancer for at least 20 years after your treatment. If Pap tests have been discontinued, your risk factors (such as having a new sexual partner) need to be reassessed to determine if screening should resume. Some women have medical problems that increase the chance of getting cervical cancer. In these cases, your health care provider may recommend more frequent screening and Pap tests.  Colorectal Cancer  This type of cancer can be detected and often prevented.  Routine colorectal cancer screening usually begins at 43 years of age and continues through 43 years of age.  Your health care provider may recommend screening at an earlier age if you have risk factors for colon cancer.  Your health care provider may also recommend using home test kits to check for hidden blood in the stool.  A small camera at the end of a tube can be used to examine your colon directly (sigmoidoscopy or colonoscopy). This is done to check for the earliest forms of colorectal cancer.  Routine screening usually begins at age 9.  Direct examination of the colon should be repeated every 5-10 years through 43 years of age. However, you may need to be screened more often if early forms of precancerous polyps or small growths are found.  Skin Cancer  Check your skin from head to toe regularly.  Tell your health care  provider about any new moles or changes in moles, especially if there is a change in a mole's shape or color.  Also tell your health care provider if you have a mole that is larger than the size of a pencil eraser.  Always use sunscreen. Apply sunscreen liberally and repeatedly throughout the day.  Protect yourself by wearing long sleeves, pants, a wide-brimmed hat, and sunglasses whenever you are outside.  Heart disease, diabetes, and high blood pressure  High blood pressure causes heart disease and increases the risk of stroke. High blood pressure is more likely to develop in: ? People who have blood pressure in the high end of the normal range (130-139/85-89 mm Hg). ? People who are overweight or obese. ? People who are African American.  If you are 28-74 years of age, have your blood pressure checked every 3-5 years. If you are 36 years of age or older, have your blood pressure checked every year.  You should have your blood pressure measured twice-once when you are at a hospital or clinic, and once when you are not at a hospital or clinic. Record the average of the two measurements. To check your blood pressure when you are not at a hospital or clinic, you can use: ? An automated blood pressure machine at a pharmacy. ? A home blood pressure monitor.  If you are between 37 years and 51 years old, ask your health care provider if you should take aspirin to prevent strokes.  Have regular diabetes screenings. This involves taking a blood sample to check your fasting blood sugar level. ? If you are at a normal weight and have a low risk for diabetes, have this test once every three years after 43 years of age. ? If you are overweight and have a high risk for diabetes, consider being tested at a younger age or more often. Preventing infection Hepatitis B  If you have a higher risk for hepatitis B, you should be screened for this virus. You are considered at high risk for hepatitis B  if: ? You were born in a country where hepatitis B is common. Ask your health care provider which countries are considered high risk. ? Your parents were born in a high-risk country, and you have not been immunized against hepatitis B (hepatitis B vaccine). ? You have HIV or AIDS. ? You use needles to inject street drugs. ? You live with someone who has hepatitis B. ? You have had sex with someone who has hepatitis B. ? You get hemodialysis treatment. ? You take certain medicines for conditions, including cancer, organ transplantation, and autoimmune conditions.  Hepatitis C  Blood testing is recommended for: ? Everyone born from [birth year] through 1966. ? Anyone with known risk factors for hepatitis C.  Sexually transmitted infections (STIs)  You should be screened for sexually transmitted infections (STIs) including gonorrhea and chlamydia if: ? You are sexually active and are younger than 43 years of age. ? You are older than 43 years of age and your health care provider tells you that you are at risk for this type of infection. ? Your sexual activity has changed since you were last screened and you are at an increased risk for chlamydia or gonorrhea. Ask your health care provider if you are at risk.  If you do not have HIV, but are at risk, it may be recommended that you take a prescription medicine daily to prevent HIV infection. This is called pre-exposure prophylaxis (PrEP). You are considered at risk if: ? You are sexually active and do not regularly use condoms or know the HIV status of your partner(s). ? You take drugs by injection. ? You are sexually active with a partner who has HIV.  Talk with your health care provider about whether you are at high risk of being infected with HIV. If you choose to begin PrEP, you should first be tested for HIV. You should then be tested every 3 months for as long as you are taking PrEP. Pregnancy  If you are premenopausal and you may become  pregnant, ask your health care provider about preconception counseling.  If you may become pregnant, take 400 to 800 micrograms (mcg) of folic acid every day.  If you want to prevent pregnancy, talk to your health care provider about birth control (contraception). Osteoporosis and menopause  Osteoporosis is a disease in which the bones lose minerals and strength with aging. This can result in  serious bone fractures. Your risk for osteoporosis can be identified using a bone density scan.  If you are 83 years of age or older, or if you are at risk for osteoporosis and fractures, ask your health care provider if you should be screened.  Ask your health care provider whether you should take a calcium or vitamin D supplement to lower your risk for osteoporosis.  Menopause may have certain physical symptoms and risks.  Hormone replacement therapy may reduce some of these symptoms and risks. Talk to your health care provider about whether hormone replacement therapy is right for you. Follow these instructions at home:  Schedule regular health, dental, and eye exams.  Stay current with your immunizations.  Do not use any tobacco products including cigarettes, chewing tobacco, or electronic cigarettes.  If you are pregnant, do not drink alcohol.  If you are breastfeeding, limit how much and how often you drink alcohol.  Limit alcohol intake to no more than 1 drink per day for nonpregnant women. One drink equals 12 ounces of beer, 5 ounces of wine, or 1 ounces of hard liquor.  Do not use street drugs.  Do not share needles.  Ask your health care provider for help if you need support or information about quitting drugs.  Tell your health care provider if you often feel depressed.  Tell your health care provider if you have ever been abused or do not feel safe at home. This information is not intended to replace advice given to you by your health care provider. Make sure you discuss any  questions you have with your health care provider. Document Released: 01/15/2011 Document Revised: 12/08/2015 Document Reviewed: 04/05/2015 Elsevier Interactive Patient Education  [employer].

## 2017-10-24 NOTE — Patient Instructions (Addendum)
   IF you received an x-ray today, you will receive an invoice from [facility]. Please contact [facility] at [phone] with questions or concerns regarding your invoice.   IF you received labwork today, you will receive an invoice from [facility]. Please contact [facility] at [phone] with questions or concerns regarding your invoice.   Our billing staff will not be able to assist you with questions regarding bills from these companies.  You will be contacted with the lab results as soon as they are available. The fastest way to get your results is to activate your My Chart account. Instructions are located on the last page of this paperwork. If you have not heard from us regarding the results in 2 weeks, please contact this office.    Health Maintenance, Female Adopting a healthy lifestyle and getting preventive care can go a long way to promote health and wellness. Talk with your health care provider about what schedule of regular examinations is right for you. This is a good chance for you to check in with your provider about disease prevention and staying healthy. In between checkups, there are plenty of things you can do on your own. Experts have done a lot of research about which lifestyle changes and preventive measures are most likely to keep you healthy. Ask your health care provider for more information. Weight and diet Eat a healthy diet  Be sure to include plenty of vegetables, fruits, low-fat dairy products, and lean protein.  Do not eat a lot of foods high in solid fats, added sugars, or salt.  Get regular exercise. This is one of the most important things you can do for your health. ? Most adults should exercise for at least 150 minutes each week. The exercise should increase your heart rate and make you sweat (moderate-intensity exercise). ? Most adults should also do strengthening exercises at least twice a week. This is in addition to the  moderate-intensity exercise.  Maintain a healthy weight  Body mass index (BMI) is a measurement that can be used to identify possible weight problems. It estimates body fat based on height and weight. Your health care provider can help determine your BMI and help you achieve or maintain a healthy weight.  For females 20 years of age and older: ? A BMI below 18.5 is considered underweight. ? A BMI of 18.5 to 24.9 is normal. ? A BMI of 25 to 29.9 is considered overweight. ? A BMI of 30 and above is considered obese.  Watch levels of cholesterol and blood lipids  You should start having your blood tested for lipids and cholesterol at 43 years of age, then have this test every 5 years.  You may need to have your cholesterol levels checked more often if: ? Your lipid or cholesterol levels are high. ? You are older than 43 years of age. ? You are at high risk for heart disease.  Cancer screening Lung Cancer  Lung cancer screening is recommended for adults 55-80 years old who are at high risk for lung cancer because of a history of smoking.  A yearly low-dose CT scan of the lungs is recommended for people who: ? Currently smoke. ? Have quit within the past 15 years. ? Have at least a 30-pack-year history of smoking. A pack year is smoking an average of one pack of cigarettes a day for 1 year.  Yearly screening should continue until it has been 15 years since you quit.  Yearly screening   should stop if you develop a health problem that would prevent you from having lung cancer treatment.  Breast Cancer  Practice breast self-awareness. This means understanding how your breasts normally appear and feel.  It also means doing regular breast self-exams. Let your health care provider know about any changes, no matter how small.  If you are in your 20s or 30s, you should have a clinical breast exam (CBE) by a health care provider every 1-3 years as part of a regular health exam.  If you  are 42 or older, have a CBE every year. Also consider having a breast X-ray (mammogram) every year.  If you have a family history of breast cancer, talk to your health care provider about genetic screening.  If you are at high risk for breast cancer, talk to your health care provider about having an MRI and a mammogram every year.  Breast cancer gene (BRCA) assessment is recommended for women who have family members with BRCA-related cancers. BRCA-related cancers include: ? Breast. ? Ovarian. ? Tubal. ? Peritoneal cancers.  Results of the assessment will determine the need for genetic counseling and BRCA1 and BRCA2 testing.  Cervical Cancer Your health care provider may recommend that you be screened regularly for cancer of the pelvic organs (ovaries, uterus, and vagina). This screening involves a pelvic examination, including checking for microscopic changes to the surface of your cervix (Pap test). You may be encouraged to have this screening done every 3 years, beginning at age 56.  For women ages 41-65, health care providers may recommend pelvic exams and Pap testing every 3 years, or they may recommend the Pap and pelvic exam, combined with testing for human papilloma virus (HPV), every 5 years. Some types of HPV increase your risk of cervical cancer. Testing for HPV may also be done on women of any age with unclear Pap test results.  Other health care providers may not recommend any screening for nonpregnant women who are considered low risk for pelvic cancer and who do not have symptoms. Ask your health care provider if a screening pelvic exam is right for you.  If you have had past treatment for cervical cancer or a condition that could lead to cancer, you need Pap tests and screening for cancer for at least 20 years after your treatment. If Pap tests have been discontinued, your risk factors (such as having a new sexual partner) need to be reassessed to determine if screening should  resume. Some women have medical problems that increase the chance of getting cervical cancer. In these cases, your health care provider may recommend more frequent screening and Pap tests.  Colorectal Cancer  This type of cancer can be detected and often prevented.  Routine colorectal cancer screening usually begins at 43 years of age and continues through 43 years of age.  Your health care provider may recommend screening at an earlier age if you have risk factors for colon cancer.  Your health care provider may also recommend using home test kits to check for hidden blood in the stool.  A small camera at the end of a tube can be used to examine your colon directly (sigmoidoscopy or colonoscopy). This is done to check for the earliest forms of colorectal cancer.  Routine screening usually begins at age 16.  Direct examination of the colon should be repeated every 5-10 years through 43 years of age. However, you may need to be screened more often if early forms of precancerous polyps or  small growths are found.  Skin Cancer  Check your skin from head to toe regularly.  Tell your health care provider about any new moles or changes in moles, especially if there is a change in a mole's shape or color.  Also tell your health care provider if you have a mole that is larger than the size of a pencil eraser.  Always use sunscreen. Apply sunscreen liberally and repeatedly throughout the day.  Protect yourself by wearing long sleeves, pants, a wide-brimmed hat, and sunglasses whenever you are outside.  Heart disease, diabetes, and high blood pressure  High blood pressure causes heart disease and increases the risk of stroke. High blood pressure is more likely to develop in: ? People who have blood pressure in the high end of the normal range (130-139/85-89 mm Hg). ? People who are overweight or obese. ? People who are African American.  If you are 61-36 years of age, have your blood  pressure checked every 3-5 years. If you are 6 years of age or older, have your blood pressure checked every year. You should have your blood pressure measured twice-once when you are at a hospital or clinic, and once when you are not at a hospital or clinic. Record the average of the two measurements. To check your blood pressure when you are not at a hospital or clinic, you can use: ? An automated blood pressure machine at a pharmacy. ? A home blood pressure monitor.  If you are between 7 years and 29 years old, ask your health care provider if you should take aspirin to prevent strokes.  Have regular diabetes screenings. This involves taking a blood sample to check your fasting blood sugar level. ? If you are at a normal weight and have a low risk for diabetes, have this test once every three years after 43 years of age. ? If you are overweight and have a high risk for diabetes, consider being tested at a younger age or more often. Preventing infection Hepatitis B  If you have a higher risk for hepatitis B, you should be screened for this virus. You are considered at high risk for hepatitis B if: ? You were born in a country where hepatitis B is common. Ask your health care provider which countries are considered high risk. ? Your parents were born in a high-risk country, and you have not been immunized against hepatitis B (hepatitis B vaccine). ? You have HIV or AIDS. ? You use needles to inject street drugs. ? You live with someone who has hepatitis B. ? You have had sex with someone who has hepatitis B. ? You get hemodialysis treatment. ? You take certain medicines for conditions, including cancer, organ transplantation, and autoimmune conditions.  Hepatitis C  Blood testing is recommended for: ? Everyone born from [birth year] through 1966. ? Anyone with known risk factors for hepatitis C.  Sexually transmitted infections (STIs)  You should be screened for sexually transmitted  infections (STIs) including gonorrhea and chlamydia if: ? You are sexually active and are younger than 43 years of age. ? You are older than 43 years of age and your health care provider tells you that you are at risk for this type of infection. ? Your sexual activity has changed since you were last screened and you are at an increased risk for chlamydia or gonorrhea. Ask your health care provider if you are at risk.  If you do not have HIV, but are at risk, it  may be recommended that you take a prescription medicine daily to prevent HIV infection. This is called pre-exposure prophylaxis (PrEP). You are considered at risk if: ? You are sexually active and do not regularly use condoms or know the HIV status of your partner(s). ? You take drugs by injection. ? You are sexually active with a partner who has HIV.  Talk with your health care provider about whether you are at high risk of being infected with HIV. If you choose to begin PrEP, you should first be tested for HIV. You should then be tested every 3 months for as long as you are taking PrEP. Pregnancy  If you are premenopausal and you may become pregnant, ask your health care provider about preconception counseling.  If you may become pregnant, take 400 to 800 micrograms (mcg) of folic acid every day.  If you want to prevent pregnancy, talk to your health care provider about birth control (contraception). Osteoporosis and menopause  Osteoporosis is a disease in which the bones lose minerals and strength with aging. This can result in serious bone fractures. Your risk for osteoporosis can be identified using a bone density scan.  If you are 65 years of age or older, or if you are at risk for osteoporosis and fractures, ask your health care provider if you should be screened.  Ask your health care provider whether you should take a calcium or vitamin D supplement to lower your risk for osteoporosis.  Menopause may have certain physical  symptoms and risks.  Hormone replacement therapy may reduce some of these symptoms and risks. Talk to your health care provider about whether hormone replacement therapy is right for you. Follow these instructions at home:  Schedule regular health, dental, and eye exams.  Stay current with your immunizations.  Do not use any tobacco products including cigarettes, chewing tobacco, or electronic cigarettes.  If you are pregnant, do not drink alcohol.  If you are breastfeeding, limit how much and how often you drink alcohol.  Limit alcohol intake to no more than 1 drink per day for nonpregnant women. One drink equals 12 ounces of beer, 5 ounces of wine, or 1 ounces of hard liquor.  Do not use street drugs.  Do not share needles.  Ask your health care provider for help if you need support or information about quitting drugs.  Tell your health care provider if you often feel depressed.  Tell your health care provider if you have ever been abused or do not feel safe at home. This information is not intended to replace advice given to you by your health care provider. Make sure you discuss any questions you have with your health care provider. Document Released: 01/15/2011 Document Revised: 12/08/2015 Document Reviewed: 04/05/2015 Elsevier Interactive Patient Education  2018 Elsevier Inc.  

## 2017-10-25 LAB — COMPREHENSIVE METABOLIC PANEL
A/G RATIO: 1.5 (ref 1.2–2.2)
ALT: 17 IU/L (ref 0–32)
AST: 23 IU/L (ref 0–40)
Albumin: 4.4 g/dL (ref 3.5–5.5)
Alkaline Phosphatase: 68 IU/L (ref 39–117)
BILIRUBIN TOTAL: 0.6 mg/dL (ref 0.0–1.2)
BUN/Creatinine Ratio: 13 (ref 9–23)
BUN: 10 mg/dL (ref 6–24)
CALCIUM: 9.1 mg/dL (ref 8.7–10.2)
CHLORIDE: 100 mmol/L (ref 96–106)
CO2: 24 mmol/L (ref 20–29)
Creatinine, Ser: 0.8 mg/dL (ref 0.57–1.00)
GFR, EST AFRICAN AMERICAN: 105 mL/min/{1.73_m2} (ref >59)
GFR, EST NON AFRICAN AMERICAN: 91 mL/min/{1.73_m2} (ref >59)
GLOBULIN, TOTAL: 3 g/dL (ref 1.5–4.5)
Glucose: 106 mg/dL — ABNORMAL HIGH (ref 65–99)
POTASSIUM: 4.2 mmol/L (ref 3.5–5.2)
SODIUM: 139 mmol/L (ref 134–144)
Total Protein: 7.4 g/dL (ref 6.0–8.5)

## 2017-10-25 LAB — LIPID PANEL
CHOL/HDL RATIO: 2.3 ratio (ref 0.0–4.4)
CHOLESTEROL TOTAL: 184 mg/dL (ref 100–199)
HDL: 79 mg/dL (ref >39)
LDL CALC: 99 mg/dL (ref 0–99)
TRIGLYCERIDES: 31 mg/dL (ref 0–149)
VLDL CHOLESTEROL CAL: 6 mg/dL (ref 5–40)

## 2017-10-25 LAB — CBC
HEMATOCRIT: 41.1 % (ref 34.0–46.6)
Hemoglobin: 13.5 g/dL (ref 11.1–15.9)
MCH: 32.6 pg (ref 26.6–33.0)
MCHC: 32.8 g/dL (ref 31.5–35.7)
MCV: 99 fL — AB (ref 79–97)
Platelets: 341 10*3/uL (ref 150–379)
RBC: 4.14 x10E6/uL (ref 3.77–5.28)
RDW: 12.6 % (ref 12.3–15.4)
WBC: 8.5 10*3/uL (ref 3.4–10.8)

## 2017-10-25 LAB — HEMOGLOBIN A1C
ESTIMATED AVERAGE GLUCOSE: 108 mg/dL
Hgb A1c MFr Bld: 5.4 % (ref 4.8–5.6)

## 2017-10-25 LAB — TSH: TSH: 1.55 u[IU]/mL (ref 0.450–4.500)

## 2017-10-26 LAB — PAP IG, CT-NG NAA, HPV HIGH-RISK
CHLAMYDIA, NUC. ACID AMP: NEGATIVE
Gonococcus by Nucleic Acid Amp: NEGATIVE
HPV, high-risk: NEGATIVE
PAP SMEAR COMMENT: 0

## 2018-02-22 ENCOUNTER — Other Ambulatory Visit: Payer: Self-pay

## 2018-02-22 ENCOUNTER — Ambulatory Visit (INDEPENDENT_AMBULATORY_CARE_PROVIDER_SITE_OTHER): Payer: No Typology Code available for payment source | Admitting: Emergency Medicine

## 2018-02-22 ENCOUNTER — Encounter: Payer: Self-pay | Admitting: Emergency Medicine

## 2018-02-22 ENCOUNTER — Ambulatory Visit (INDEPENDENT_AMBULATORY_CARE_PROVIDER_SITE_OTHER): Payer: No Typology Code available for payment source

## 2018-02-22 VITALS — BP 134/77 | HR 90 | Temp 99.1°F | Resp 20 | Ht 62.99 in | Wt 164.4 lb

## 2018-02-22 DIAGNOSIS — M25561 Pain in right knee: Secondary | ICD-10-CM | POA: Diagnosis not present

## 2018-02-22 DIAGNOSIS — S8391XA Sprain of unspecified site of right knee, initial encounter: Secondary | ICD-10-CM | POA: Diagnosis not present

## 2018-02-22 MED ORDER — DICLOFENAC SODIUM 75 MG PO TBEC: 75.0000 mg | DELAYED_RELEASE_TABLET | Freq: Two times a day (BID) | ORAL | 0 refills | Status: AC

## 2018-02-22 NOTE — Progress Notes (Signed)
Monica Hendrix 42 y.o.   Chief Complaint  Patient presents with  . Knee Pain    X 2 weeks- right knee    HISTORY OF PRESENT ILLNESS: This is a 42 y.o. female complaining of right knee pain for 2 weeks.  May have injured it while working out.  HPI   Prior to Admission medications   Not on File    Allergies  Allergen Reactions  . Macrobid [Nitrofurantoin Macrocrystal] Hives  . Vicodin [Hydrocodone-Acetaminophen] Itching    Patient Active Problem List   Diagnosis Date Noted  . Uterine leiomyoma 05/02/2017    Past Medical History:  Diagnosis Date  . Fibroids   . No pertinent past medical history     Past Surgical History:  Procedure Laterality Date  . CESAREAN SECTION    . CESAREAN SECTION  04/14/2011   Procedure: CESAREAN SECTION;  Surgeon: Frederico Hamman, MD;  Location: [facility];  Service: Gynecology;  Laterality: N/A;  Repeat of baby girl at 38 APGAR 2/7    Social History   Socioeconomic History  . Marital status: Married    Spouse name: Not on file  . Number of children: 2  . Years of education: Not on file  . Highest education level: Not on file  Occupational History  . Not on file  Social Needs  . Financial resource strain: Not on file  . Food insecurity:    Worry: Not on file    Inability: Not on file  . Transportation needs:    Medical: Not on file    Non-medical: Not on file  Tobacco Use  . Smoking status: Never Smoker  . Smokeless tobacco: Never Used  Substance and Sexual Activity  . Alcohol use: Yes    Alcohol/week: 1.0 standard drinks    Types: 1 Glasses of wine per week    Comment: SOCIALLY  . Drug use: No  . Sexual activity: Yes    Birth control/protection: None  Lifestyle  . Physical activity:    Days per week: Not on file    Minutes per session: Not on file  . Stress: Not on file  Relationships  . Social connections:    Talks on phone: Not on file    Gets together: Not on file    Attends religious service: Not on file   Active member of club or organization: Not on file    Attends meetings of clubs or organizations: Not on file    Relationship status: Not on file  . Intimate partner violence:    Fear of current or ex partner: Not on file    Emotionally abused: Not on file    Physically abused: Not on file    Forced sexual activity: Not on file  Other Topics Concern  . Not on file  Social History Narrative  . Not on file    Family History  Problem Relation Age of Onset  . Diabetes Mother   . Hypertension Mother   . Cancer Father   . Diabetes Father      Review of Systems  Constitutional: Negative.  Negative for chills and fever.  Respiratory: Negative for cough and shortness of breath.   Cardiovascular: Negative for chest pain and palpitations.  Gastrointestinal: Negative for abdominal pain, nausea and vomiting.  Genitourinary: Negative.   Musculoskeletal: Positive for joint pain (Right knee).  Skin: Negative.  Negative for rash.  Neurological: Negative.  Negative for dizziness and headaches.  Endo/Heme/Allergies: Negative.   All other systems reviewed  and are negative.   Vitals:   02/22/18 1202  BP: 134/77  Pulse: 90  Resp: 20  Temp: 99.1 F (37.3 C)  SpO2: 97%    Physical Exam  Constitutional: She is oriented to person, place, and time. She appears well-developed and well-nourished.  HENT:  Head: Normocephalic and atraumatic.  Eyes: Pupils are equal, round, and reactive to light. EOM are normal.  Neck: Normal range of motion. Neck supple.  Cardiovascular: Normal rate and regular rhythm.  Pulmonary/Chest: Effort normal.  Musculoskeletal:  Right knee: No bruising or erythema.  No swelling.  Mild medial tenderness.  Full range of motion.  Stable in flexion and extension. Right lower extremity: No signs of DVT.  Neurological: She is alert and oriented to person, place, and time. No sensory deficit. She exhibits normal muscle tone.  Skin: Skin is warm and dry. Capillary refill  takes less than 2 seconds.  Psychiatric: She has a normal mood and affect. Her behavior is normal.  Vitals reviewed.  Dg Knee Complete 4 Views Right  Result Date: 02/22/2018 CLINICAL DATA:  Right knee pain for 2 weeks.  No known injury. EXAM: RIGHT KNEE - COMPLETE 4+ VIEW COMPARISON:  None. FINDINGS: No evidence of fracture, dislocation, or joint effusion. No evidence of arthropathy or other focal bone abnormality. Soft tissues are unremarkable. IMPRESSION: Negative. Electronically Signed   By: Lajean Manes M.D.   On: 02/22/2018 12:46     ASSESSMENT & PLAN: Monica Hendrix was seen today for knee pain.  Diagnoses and all orders for this visit:  Acute pain of right knee -     DG Knee Complete 4 Views Right; Future -     diclofenac (VOLTAREN) 75 MG EC tablet; Take 1 tablet (75 mg total) by mouth 2 (two) times daily for 5 days. After 5 days take as needed.  Sprain of right knee, unspecified ligament, initial encounter    Patient Instructions       IF you received an x-ray today, you will receive an invoice from [facility]. Please contact [facility] at [phone] with questions or concerns regarding your invoice.   IF you received labwork today, you will receive an invoice from [facility]. Please contact [facility] at [phone] with questions or concerns regarding your invoice.   Our billing staff will not be able to assist you with questions regarding bills from these companies.  You will be contacted with the lab results as soon as they are available. The fastest way to get your results is to activate your My Chart account. Instructions are located on the last page of this paperwork. If you have not heard from [place] regarding the results in 2 weeks, please contact this office.     Knee Pain, Adult Many things can cause knee pain. The pain often goes away on its own with time and rest. If the pain does not go away, tests may be done to find out what is causing the  pain. Follow these instructions at home: Activity  Rest your knee.  Do not do things that cause pain.  Avoid activities where both feet leave the ground at the same time (high-impact activities). Examples are running, jumping rope, and doing jumping jacks. General instructions  Take medicines only as told by your doctor.  Raise (elevate) your knee when you are resting. Make sure your knee is higher than your heart.  Sleep with a pillow under your knee.  If told, put ice on the knee: ? Put ice in  a plastic bag. ? Place a towel between your skin and the bag. ? Leave the ice on for 20 minutes, 2-3 times a day.  Ask your doctor if you should wear an elastic knee support.  Lose weight if you are overweight. Being overweight can make your knee hurt more.  Do not use any tobacco products. These include cigarettes, chewing tobacco, or electronic cigarettes. If you need help quitting, ask your doctor. Smoking may slow down healing. Contact a doctor if:  The pain does not stop.  The pain changes or gets worse.  You have a fever along with knee pain.  Your knee gives out or locks up.  Your knee swells, and becomes worse. Get help right away if:  Your knee feels warm.  You cannot move your knee.  You have very bad knee pain.  You have chest pain.  You have trouble breathing. Summary  Many things can cause knee pain. The pain often goes away on its own with time and rest.  Avoid activities that put stress on your knee. These include running and jumping rope.  Get help right away if you cannot move your knee, or if your knee feels warm, or if you have trouble breathing. This information is not intended to replace advice given to you by your health care provider. Make sure you discuss any questions you have with your health care provider. Document Released: 09/28/2008 Document Revised: 06/26/2016 Document Reviewed: 06/26/2016 Elsevier Interactive Patient Education  2017  Elsevier Inc.      Agustina Caroli, MD [facility]

## 2018-02-22 NOTE — Patient Instructions (Addendum)
     IF you received an x-ray today, you will receive an invoice from [facility]. Please contact [facility] at [phone] with questions or concerns regarding your invoice.   IF you received labwork today, you will receive an invoice from [facility]. Please contact [facility] at [phone] with questions or concerns regarding your invoice.   Our billing staff will not be able to assist you with questions regarding bills from these companies.  You will be contacted with the lab results as soon as they are available. The fastest way to get your results is to activate your My Chart account. Instructions are located on the last page of this paperwork. If you have not heard from us regarding the results in 2 weeks, please contact this office.      Knee Pain, Adult Many things can cause knee pain. The pain often goes away on its own with time and rest. If the pain does not go away, tests may be done to find out what is causing the pain. Follow these instructions at home: Activity  Rest your knee.  Do not do things that cause pain.  Avoid activities where both feet leave the ground at the same time (high-impact activities). Examples are running, jumping rope, and doing jumping jacks. General instructions  Take medicines only as told by your doctor.  Raise (elevate) your knee when you are resting. Make sure your knee is higher than your heart.  Sleep with a pillow under your knee.  If told, put ice on the knee: ? Put ice in a plastic bag. ? Place a towel between your skin and the bag. ? Leave the ice on for 20 minutes, 2-3 times a day.  Ask your doctor if you should wear an elastic knee support.  Lose weight if you are overweight. Being overweight can make your knee hurt more.  Do not use any tobacco products. These include cigarettes, chewing tobacco, or electronic cigarettes. If you need help quitting, ask your doctor. Smoking may slow down healing. Contact a  doctor if:  The pain does not stop.  The pain changes or gets worse.  You have a fever along with knee pain.  Your knee gives out or locks up.  Your knee swells, and becomes worse. Get help right away if:  Your knee feels warm.  You cannot move your knee.  You have very bad knee pain.  You have chest pain.  You have trouble breathing. Summary  Many things can cause knee pain. The pain often goes away on its own with time and rest.  Avoid activities that put stress on your knee. These include running and jumping rope.  Get help right away if you cannot move your knee, or if your knee feels warm, or if you have trouble breathing. This information is not intended to replace advice given to you by your health care provider. Make sure you discuss any questions you have with your health care provider. Document Released: 09/28/2008 Document Revised: 06/26/2016 Document Reviewed: 06/26/2016 Elsevier Interactive Patient Education  2017 Elsevier Inc.  

## 2018-07-20 ENCOUNTER — Ambulatory Visit (INDEPENDENT_AMBULATORY_CARE_PROVIDER_SITE_OTHER): Payer: Self-pay | Admitting: Nurse Practitioner

## 2018-07-20 ENCOUNTER — Encounter: Payer: Self-pay | Admitting: Nurse Practitioner

## 2018-07-20 VITALS — BP 112/64 | HR 95 | Temp 99.0°F | Wt 164.2 lb

## 2018-07-20 DIAGNOSIS — B379 Candidiasis, unspecified: Secondary | ICD-10-CM

## 2018-07-20 MED ORDER — FLUCONAZOLE 150 MG PO TABS: 150.0000 mg | ORAL_TABLET | Freq: Once | ORAL | 0 refills | Status: AC

## 2018-07-20 NOTE — Progress Notes (Signed)
Subjective:     Monica Hendrix is a 43 y.o. female who presents for evaluation of an abnormal vaginal discharge. Symptoms have been present for 5 days. Vaginal symptoms: discharge described as odorless and "cottage cheese appearance", local irritation and vulvar itching. Contraception: none. She denies abnormal bleeding, blisters, bumps, burning, dyspareunia, odor and urinary symptoms of chills, cloudy urine, dysuria, flank pain bilaterally, hematuria, urinary frequency, urinary hesitancy and urinary urgency Sexually transmitted infection risk: very low risk of STD exposure. Menstrual flow: regular every 28-30 days, patient's LMP was mid December.  States when her symptoms started she went to the pharmacy and purchase over-the-counter Monistat vaginal cream.  Patient states when she inserted the cream, she felt like her insides were "burning".  Patient states the next day she reattempted to use the same cream and had the same symptoms.  States she normally uses the vaginal suppository but she changed up this time.  Patient also informs that she also changed her bath soap.  The following portions of the patient's history were reviewed and updated as appropriate: allergies, current medications and past medical history.   Review of Systems Constitutional: negative Eyes: negative Ears, nose, mouth, throat, and face: negative Respiratory: negative Cardiovascular: negative Gastrointestinal: negative Genitourinary:positive for vaginal discharge, negative for abnormal menstrual periods and sexual problems, dysuria, frequency and hematuria    Objective:    BP 112/64   Pulse 95   Temp 99 F (37.2 C)   Wt 164 lb 3.2 oz (74.5 kg)   SpO2 97%   BMI 29.09 kg/m  Physical Exam Constitutional:      General: She is not in acute distress.    Appearance: Normal appearance. She is not ill-appearing.  HENT:     Head: Normocephalic.     Right Ear: Tympanic membrane and ear canal normal.     Left Ear: Tympanic  membrane and ear canal normal.  Eyes:     Pupils: Pupils are equal, round, and reactive to light.  Neck:     Musculoskeletal: Normal range of motion. No neck rigidity or muscular tenderness.  Cardiovascular:     Rate and Rhythm: Normal rate and regular rhythm.     Pulses: Normal pulses.     Heart sounds: Normal heart sounds.  Pulmonary:     Effort: Pulmonary effort is normal.     Breath sounds: Normal breath sounds.  Abdominal:     General: Abdomen is flat.     Tenderness: There is no abdominal tenderness.  Genitourinary:    Vagina: Vaginal discharge present.     Comments: Mild labial irritation, + white cottage cheese appearing discharge. No odor, vaginal bleeding Skin:    General: Skin is warm and dry.  Neurological:     General: No focal deficit present.     Mental Status: She is alert.     Cranial Nerves: No cranial nerve deficit.  Psychiatric:        Mood and Affect: Mood normal.        Thought Content: Thought content normal.      Assessment:    Yeast Infection.    Plan:   Exam findings, diagnosis etiology and medication use and indications reviewed with patient. Follow- Up and discharge instructions provided. No emergent/urgent issues found on exam.  On the patient's clinical presentation, symptoms, and physical assessment, patient's exam is consistent with a yeast infection.  Discussed with patient that eating yogurt will also help current symptoms.  Patient was given a prescription for Diflucan.  Patient instructed to follow-up with her GYN if symptoms did not improve.  Also instructed patient to continue use of the vaginal suppository in the future.  Patient education was provided. Patient verbalized understanding of information provided and agrees with plan of care (POC), all questions answered. The patient is advised to call or return to clinic if condition does not see an improvement in symptoms, or to seek the care of the closest emergency department if condition  worsens with the above plan.   1. Yeast infection  - fluconazole (DIFLUCAN) 150 MG tablet; Take 1 tablet (150 mg total) by mouth once for 1 dose. May repeat every 72 hours up to 2 additional doses.  Dispense: 3 tablet; Refill: 0 -Take medication as prescribed. -Warm salt water soaks to help with vaginal irritation and swelling. -Avoid heavy use of deodorant soaps, body washes, and perfumes. -May be helpful to eat yogurt while symptoms are persistent. -Refrain from sexual activity until symptoms improve. -Avoid wearing tight clothing until symptoms improve. -Follow-up with your GYN if symptoms do not improve.

## 2018-07-20 NOTE — Patient Instructions (Signed)
Vaginal Yeast infection, Adult -Take medication as prescribed. -Warm salt water soaks to help with vaginal irritation and swelling. -Avoid heavy use of deodorant soaps, body washes, and perfumes. -May be helpful to eat yogurt while symptoms are persistent. -Refrain from sexual activity until symptoms improve. -Avoid wearing tight clothing until symptoms improve. -Follow-up with your GYN if symptoms do not improve. Vaginal yeast infection is a condition that causes vaginal discharge as well as soreness, swelling, and redness (inflammation) of the vagina. This is a common condition. Some women get this infection frequently. What are the causes? This condition is caused by a change in the normal balance of the yeast (candida) and bacteria that live in the vagina. This change causes an overgrowth of yeast, which causes the inflammation. What increases the risk? The condition is more likely to develop in women who:  Take antibiotic medicines.  Have diabetes.  Take birth control pills.  Are pregnant.  Douche often.  Have a weak body defense system (immune system).  Have been taking steroid medicines for a long time.  Frequently wear tight clothing. What are the signs or symptoms? Symptoms of this condition include:  White, thick, creamy vaginal discharge.  Swelling, itching, redness, and irritation of the vagina. The lips of the vagina (vulva) may be affected as well.  Pain or a burning feeling while urinating.  Pain during sex. How is this diagnosed? This condition is diagnosed based on:  Your medical history.  A physical exam.  A pelvic exam. Your health care provider will examine a sample of your vaginal discharge under a microscope. Your health care provider may send this sample for testing to confirm the diagnosis. How is this treated? This condition is treated with medicine. Medicines may be over-the-counter or prescription. You may be told to use one or more of the  following:  Medicine that is taken by mouth (orally).  Medicine that is applied as a cream (topically).  Medicine that is inserted directly into the vagina (suppository). Follow these instructions at home:  Lifestyle  Do not have sex until your health care provider approves. Tell your sex partner that you have a yeast infection. That person should go to his or her health care provider and ask if they should also be treated.  Do not wear tight clothes, such as pantyhose or tight pants.  Wear breathable cotton underwear. General instructions  Take or apply over-the-counter and prescription medicines only as told by your health care provider.  Eat more yogurt. This may help to keep your yeast infection from returning.  Do not use tampons until your health care provider approves.  Try taking a sitz bath to help with discomfort. This is a warm water bath that is taken while you are sitting down. The water should only come up to your hips and should cover your buttocks. Do this 3-4 times per day or as told by your health care provider.  Do not douche.  If you have diabetes, keep your blood sugar levels under control.  Keep all follow-up visits as told by your health care provider. This is important. Contact a health care provider if:  You have a fever.  Your symptoms go away and then return.  Your symptoms do not get better with treatment.  Your symptoms get worse.  You have new symptoms.  You develop blisters in or around your vagina.  You have blood coming from your vagina and it is not your menstrual period.  You develop pain in  your abdomen. Summary  Vaginal yeast infection is a condition that causes discharge as well as soreness, swelling, and redness (inflammation) of the vagina.  This condition is treated with medicine. Medicines may be over-the-counter or prescription.  Take or apply over-the-counter and prescription medicines only as told by your health care  provider.  Do not douche. Do not have sex or use tampons until your health care provider approves.  Contact a health care provider if your symptoms do not get better with treatment or your symptoms go away and then return. This information is not intended to replace advice given to you by your health care provider. Make sure you discuss any questions you have with your health care provider. Document Released: 04/11/2005 Document Revised: 11/18/2017 Document Reviewed: 11/18/2017 Elsevier Interactive Patient Education  2019 [employer].

## 2018-11-23 ENCOUNTER — Telehealth: Payer: Self-pay | Admitting: Family

## 2018-11-23 DIAGNOSIS — N390 Urinary tract infection, site not specified: Secondary | ICD-10-CM

## 2018-11-23 MED ORDER — CEPHALEXIN 500 MG PO CAPS: 500.0000 mg | ORAL_CAPSULE | Freq: Two times a day (BID) | ORAL | 0 refills | Status: DC

## 2018-11-23 NOTE — Progress Notes (Signed)
Greater than 5 minutes, yet less than 10 minutes of time have been spent researching, coordinating, and implementing care for this patient today.  Thank you for the details you included in the comment boxes. Those details are very helpful in determining the best course of treatment for you and help us to provide the best care.  We are sorry that you are not feeling well.  Here is how we plan to help!  Based on what you shared with me it looks like you most likely have a simple urinary tract infection.  A UTI (Urinary Tract Infection) is a bacterial infection of the bladder.  Most cases of urinary tract infections are simple to treat but a key part of your care is to encourage you to drink plenty of fluids and watch your symptoms carefully.  I have prescribed Keflex 500 mg twice a day for 7 days.  Your symptoms should gradually improve. Call us if the burning in your urine worsens, you develop worsening fever, back pain or pelvic pain or if your symptoms do not resolve after completing the antibiotic.  Urinary tract infections can be prevented by drinking plenty of water to keep your body hydrated.  Also be sure when you wipe, wipe from front to back and don't hold it in!  If possible, empty your bladder every 4 hours.  Your e-visit answers were reviewed by a board certified advanced clinical practitioner to complete your personal care plan.  Depending on the condition, your plan could have included both over the counter or prescription medications.  If there is a problem please reply  once you have received a response from your provider.  Your safety is important to us.  If you have drug allergies check your prescription carefully.    You can use MyChart to ask questions about today's visit, request a non-urgent call back, or ask for a work or school excuse for 24 hours related to this e-Visit. If it has been greater than 24 hours you will need to follow up with your provider, or enter a new  e-Visit to address those concerns.   You will get an e-mail in the next two days asking about your experience.  I hope that your e-visit has been valuable and will speed your recovery. Thank you for using e-visits.    

## 2018-11-26 ENCOUNTER — Telehealth: Payer: Self-pay | Admitting: Family

## 2018-11-26 DIAGNOSIS — B373 Candidiasis of vulva and vagina: Secondary | ICD-10-CM

## 2018-11-26 DIAGNOSIS — B3731 Acute candidiasis of vulva and vagina: Secondary | ICD-10-CM

## 2018-11-26 MED ORDER — FLUCONAZOLE 150 MG PO TABS: 150.0000 mg | ORAL_TABLET | ORAL | 0 refills | Status: DC | PRN

## 2018-11-26 NOTE — Progress Notes (Signed)
We are sorry that you are not feeling well. Here is how we plan to help! Based on what you shared with me it looks like you: May have a yeast vaginosis.  Approximately 5 minutes was spent documenting and reviewing patient's chart.    Vaginosis is an inflammation of the vagina that can result in discharge, itching and pain. The cause is usually a change in the normal balance of vaginal bacteria or an infection. Vaginosis can also result from reduced estrogen levels after menopause.  The most common causes of vaginosis are:   Bacterial vaginosis which results from an overgrowth of one on several organisms that are normally present in your vagina.   Yeast infections which are caused by a naturally occurring fungus called candida.   Vaginal atrophy (atrophic vaginosis) which results from the thinning of the vagina from reduced estrogen levels after menopause.   Trichomoniasis which is caused by a parasite and is commonly transmitted by sexual intercourse.  Factors that increase your risk of developing vaginosis include: . Medications, such as antibiotics and steroids . Uncontrolled diabetes . Use of hygiene products such as bubble bath, vaginal spray or vaginal deodorant . Douching . Wearing damp or tight-fitting clothing . Using an intrauterine device (IUD) for birth control . Hormonal changes, such as those associated with pregnancy, birth control pills or menopause . Sexual activity . Having a sexually transmitted infection  Your treatment plan is A single Diflucan (fluconazole) 150mg tablet once.  I have electronically sent this prescription into the pharmacy that you have chosen.  Be sure to take all of the medication as directed. Stop taking any medication if you develop a rash, tongue swelling or shortness of breath. Mothers who are breast feeding should consider pumping and discarding their breast milk while on these antibiotics. However, there is no consensus that infant exposure  at these doses would be harmful.  Remember that medication creams can weaken latex condoms. .   HOME CARE:  Good hygiene may prevent some types of vaginosis from recurring and may relieve some symptoms:  . Avoid baths, hot tubs and whirlpool spas. Rinse soap from your outer genital area after a shower, and dry the area well to prevent irritation. Don't use scented or harsh soaps, such as those with deodorant or antibacterial action. . Avoid irritants. These include scented tampons and pads. . Wipe from front to back after using the toilet. Doing so avoids spreading fecal bacteria to your vagina.  Other things that may help prevent vaginosis include:  . Don't douche. Your vagina doesn't require cleansing other than normal bathing. Repetitive douching disrupts the normal organisms that reside in the vagina and can actually increase your risk of vaginal infection. Douching won't clear up a vaginal infection. . Use a latex condom. Both female and female latex condoms may help you avoid infections spread by sexual contact. . Wear cotton underwear. Also wear pantyhose with a cotton crotch. If you feel comfortable without it, skip wearing underwear to bed. Yeast thrives in moist environments Your symptoms should improve in the next day or two.  GET HELP RIGHT AWAY IF:  . You have pain in your lower abdomen ( pelvic area or over your ovaries) . You develop nausea or vomiting . You develop a fever . Your discharge changes or worsens . You have persistent pain with intercourse . You develop shortness of breath, a rapid pulse, or you faint.  These symptoms could be signs of problems or infections that need to   be evaluated by a medical provider now.  MAKE SURE YOU    Understand these instructions.  Will watch your condition.  Will get help right away if you are not doing well or get worse.  Your e-visit answers were reviewed by a board certified advanced clinical practitioner to complete  your personal care plan. Depending upon the condition, your plan could have included both over the counter or prescription medications. Please review your pharmacy choice to make sure that you have choses a pharmacy that is open for you to pick up any needed prescription, Your safety is important to us. If you have drug allergies check your prescription carefully.   You can use MyChart to ask questions about today's visit, request a non-urgent call back, or ask for a work or school excuse for 24 hours related to this e-Visit. If it has been greater than 24 hours you will need to follow up with your provider, or enter a new e-Visit to address those concerns. You will get a MyChart message within the next two days asking about your experience. I hope that your e-visit has been valuable and will speed your recovery.  

## 2018-12-31 ENCOUNTER — Other Ambulatory Visit: Payer: Self-pay

## 2018-12-31 ENCOUNTER — Telehealth (INDEPENDENT_AMBULATORY_CARE_PROVIDER_SITE_OTHER): Payer: No Typology Code available for payment source | Admitting: Family Medicine

## 2018-12-31 ENCOUNTER — Encounter: Payer: Self-pay | Admitting: Family Medicine

## 2018-12-31 ENCOUNTER — Telehealth: Payer: No Typology Code available for payment source | Admitting: Family Medicine

## 2018-12-31 VITALS — Wt 160.0 lb

## 2018-12-31 DIAGNOSIS — G4709 Other insomnia: Secondary | ICD-10-CM

## 2018-12-31 NOTE — Patient Instructions (Signed)
° ° ° °  If you have lab work done today you will be contacted with your lab results within the next 2 weeks.  If you have not heard from us then please contact us. The fastest way to get your results is to register for My Chart. ° ° °IF you received an x-ray today, you will receive an invoice from [facility]. Please contact [facility] at [phone] with questions or concerns regarding your invoice.  ° °IF you received labwork today, you will receive an invoice from [facility]. Please contact [facility] at [phone] with questions or concerns regarding your invoice.  ° °Our billing staff will not be able to assist you with questions regarding bills from these companies. ° °You will be contacted with the lab results as soon as they are available. The fastest way to get your results is to activate your My Chart account. Instructions are located on the last page of this paperwork. If you have not heard from us regarding the results in 2 weeks, please contact this office. °  ° ° ° °

## 2018-12-31 NOTE — Progress Notes (Signed)
Telemedicine Encounter- SOAP NOTE Established Patient  This telephone encounter was conducted with the patient's (or proxy's) verbal consent via audio telecommunications: yes/no: Yes Patient was instructed to have this encounter in a suitably private space; and to only have persons present to whom they give permission to participate. In addition, patient identity was confirmed by use of name plus two identifiers (DOB and address).  I discussed the limitations, risks, security and privacy concerns of performing an evaluation and management service by telephone and the availability of in person appointments. I also discussed with the patient that there may be a patient responsible charge related to this service. The patient expressed understanding and agreed to proceed.  I spent a total of TIME; 0 MIN TO 60 MIN: 20 minutes talking with the patient or their proxy.  CC:  I can't sleep  Subjective    Monica Hendrix is a 43 y.o. established patient. Telephone visit today for  HPI  She reports that she is waking up every 2-3 times at night and can only get about 4 hours of sleep.  When she wakes up she is up for an hour at a time. Onset 3 weeks now She states that she is physically exhausted She denies changes to her sleep routine and eating  She states that she has less appetite and is very tired She is not drinking alcohol at bedtime.  No wine in the past 2 weeks  She denies weight loss, weight gain, no hair thinning.  She snores.   Depression screen [facility] 2/9 12/31/2018 02/22/2018 10/24/2017 08/08/2017  Decreased Interest 0 0 0 0  Down, Depressed, Hopeless 0 0 0 0  PHQ - 2 Score 0 0 0 0      Patient Active Problem List   Diagnosis Date Noted  . Acute pain of right knee 02/22/2018  . Sprain of right knee 02/22/2018  . Uterine leiomyoma 05/02/2017    Past Medical History:  Diagnosis Date  . Fibroids   . No pertinent past medical history     Current Outpatient Medications   Medication Sig Dispense Refill  . cholecalciferol (VITAMIN D3) 25 MCG (1000 UT) tablet Take 1,000 Units by mouth 2 (two) times daily.    . Multiple Vitamins-Minerals (MULTIVITAMIN WITH MINERALS) tablet Take 1 tablet by mouth daily.    . vitamin E (VITAMIN E) 1000 UNIT capsule Take 1,000 Units by mouth daily. Takes when she remembers but not daily    . cephALEXin (KEFLEX) 500 MG capsule Take 1 capsule (500 mg total) by mouth 2 (two) times daily. (Patient not taking: Reported on 12/31/2018) 14 capsule 0  . fluconazole (DIFLUCAN) 150 MG tablet Take 1 tablet (150 mg total) by mouth every three (3) days as needed. (Patient not taking: Reported on 12/31/2018) 3 tablet 0   No current facility-administered medications for this visit.     Allergies  Allergen Reactions  . Macrobid [Nitrofurantoin Macrocrystal] Hives  . Vicodin [Hydrocodone-Acetaminophen] Itching    Social History   Socioeconomic History  . Marital status: Married    Spouse name: Not on file  . Number of children: 2  . Years of education: Not on file  . Highest education level: Not on file  Occupational History  . Not on file  Social Needs  . Financial resource strain: Not on file  . Food insecurity    Worry: Not on file    Inability: Not on file  . Transportation needs    Medical: Not on  file    Non-medical: Not on file  Tobacco Use  . Smoking status: Never Smoker  . Smokeless tobacco: Never Used  Substance and Sexual Activity  . Alcohol use: Yes    Alcohol/week: 1.0 standard drinks    Types: 1 Glasses of wine per week    Comment: SOCIALLY  . Drug use: No  . Sexual activity: Yes    Birth control/protection: None  Lifestyle  . Physical activity    Days per week: Not on file    Minutes per session: Not on file  . Stress: Not on file  Relationships  . Social Herbalist on phone: Not on file    Gets together: Not on file    Attends religious service: Not on file    Active member of club or  organization: Not on file    Attends meetings of clubs or organizations: Not on file    Relationship status: Not on file  . Intimate partner violence    Fear of current or ex partner: Not on file    Emotionally abused: Not on file    Physically abused: Not on file    Forced sexual activity: Not on file  Other Topics Concern  . Not on file  Social History Narrative  . Not on file    ROS  Review of Systems  Constitutional: Negative for activity change, appetite change, chills and fever.  HENT: Negative for congestion, nosebleeds, trouble swallowing and voice change.   Respiratory: Negative for cough, shortness of breath and wheezing.   Gastrointestinal: Negative for diarrhea, nausea and vomiting.  Genitourinary: Negative for difficulty urinating, dysuria, flank pain and hematuria.  Musculoskeletal: Negative for back pain, joint swelling and neck pain.  Neurological: Negative for dizziness, speech difficulty, light-headedness and numbness.  See HPI. All other review of systems negative.    Objective   Vitals as reported by the patient: Today's Vitals   12/31/18 1456  Weight: 160 lb (72.6 kg)    Diagnoses and all orders for this visit:  Other insomnia -     TSH; Future -     CBC; Future -     Ferritin; Future   Will check for deficiencies  Will plan for sleepy tea (valerian root) tea If all labs are normal will try seroquel Will consider sleep study if seroquel does not work     I discussed the assessment and treatment plan with the patient. The patient was provided an opportunity to ask questions and all were answered. The patient agreed with the plan and demonstrated an understanding of the instructions.   The patient was advised to call back or seek an in-person evaluation if the symptoms worsen or if the condition fails to improve as anticipated.  I provided 20 minutes of non-face-to-face time during this encounter.  Forrest Moron, MD  Primary Care at  [facility]

## 2018-12-31 NOTE — Progress Notes (Signed)
CC: fatigue and not able to sleep at night.  GAD score: 4.  Per pt unable to sleep at night, she wakes 2-3 times per night and averages approx. 4-5 hrs total sleep and is very tired when she is at work but manages to function.  Recent weight taken but no bp.  No travel outside the [place] or [place] in the past 3 weeks.

## 2018-12-31 NOTE — Progress Notes (Signed)
Previous documentation did not indicate the appropriate type of visit.  This was a doximity video visit.  I was able to see the patient face to face Verbal consent was given for the video call.

## 2019-01-01 ENCOUNTER — Other Ambulatory Visit: Payer: Self-pay

## 2019-01-01 ENCOUNTER — Ambulatory Visit (INDEPENDENT_AMBULATORY_CARE_PROVIDER_SITE_OTHER): Payer: No Typology Code available for payment source | Admitting: Family Medicine

## 2019-01-01 DIAGNOSIS — G4709 Other insomnia: Secondary | ICD-10-CM

## 2019-01-02 LAB — CBC
Hematocrit: 39 % (ref 34.0–46.6)
Hemoglobin: 13 g/dL (ref 11.1–15.9)
MCH: 32.4 pg (ref 26.6–33.0)
MCHC: 33.3 g/dL (ref 31.5–35.7)
MCV: 97 fL (ref 79–97)
Platelets: 300 10*3/uL (ref 150–450)
RBC: 4.01 x10E6/uL (ref 3.77–5.28)
RDW: 11.8 % (ref 11.7–15.4)
WBC: 11.5 10*3/uL — ABNORMAL HIGH (ref 3.4–10.8)

## 2019-01-02 LAB — FERRITIN: Ferritin: 97 ng/mL (ref 15–150)

## 2019-01-02 LAB — TSH: TSH: 1.07 u[IU]/mL (ref 0.450–4.500)

## 2019-01-05 ENCOUNTER — Encounter: Payer: Self-pay | Admitting: Family Medicine

## 2019-01-05 ENCOUNTER — Other Ambulatory Visit: Payer: Self-pay | Admitting: Family Medicine

## 2019-01-05 MED ORDER — QUETIAPINE FUMARATE 25 MG PO TABS: 25.0000 mg | ORAL_TABLET | Freq: Every day | ORAL | 0 refills | Status: DC

## 2019-01-07 ENCOUNTER — Encounter (HOSPITAL_COMMUNITY): Payer: Self-pay | Admitting: Emergency Medicine

## 2019-01-07 ENCOUNTER — Ambulatory Visit (HOSPITAL_COMMUNITY)
Admission: EM | Admit: 2019-01-07 | Discharge: 2019-01-07 | Disposition: A | Discharge status: Home / Self Care | Payer: No Typology Code available for payment source | Attending: Family Medicine | Admitting: Family Medicine

## 2019-01-07 DIAGNOSIS — R5382 Chronic fatigue, unspecified: Secondary | ICD-10-CM

## 2019-01-07 NOTE — Discharge Instructions (Addendum)
REPEAT 134/93 Follow up with PCP regarding additional that you mentioned in our appointment.

## 2019-01-07 NOTE — ED Triage Notes (Signed)
Pt states for the last 3 weeks shes felt fatigue and weak without any appetite. Went to her PCP last week and had some labs done. No pain.

## 2019-01-08 ENCOUNTER — Encounter (HOSPITAL_COMMUNITY): Payer: Self-pay | Admitting: Emergency Medicine

## 2019-01-08 NOTE — ED Provider Notes (Signed)
[facility]    CSN: [id number] Arrival date & time: 01/07/19  1538     History   Chief Complaint Chief Complaint  Patient presents with  . Weakness  . Fatigue    HPI Monica Hendrix is a 43 y.o. female with history of chronic fatigue and insomnia presenting for further evaluation of fatigue.  Patient states that she consulted her PCP on 6/17 via virtual visit, HPI as below:  "She reports that she is waking up every 2-3 times at night and can only get about 4 hours of sleep.  When she wakes up she is up for an hour at a time. Onset 3 weeks now She states that she is physically exhausted She denies changes to her sleep routine and eating  She states that she has less appetite and is very tired She is not drinking alcohol at bedtime.  No wine in the past 2 weeks  She denies weight loss, weight gain, no hair thinning.  She snores"  Patient states she was seen via telemedicine, had ferritin, CBC, TSH done on 01/01/2019: Only abnormality was a mildly increased WBC of 11.5.  Patient states since onset with her PCP she has not had any change in her symptoms.  Patient denies lightheadedness, dizziness, weakness, fever, malaise, chest/abdominal pain, cough.  Patient states that she spoke with a friend, wanted to get further blood work done including various "vitamin levels and different blood levels or something like that ".  She states that she tried reaching out to her PCP via online portal on 6/22, though has not heard back yet regardin mildly elevated white blood cell count patient will lab work.    Past Medical History:  Diagnosis Date  . Fibroids   . No pertinent past medical history     Patient Active Problem List   Diagnosis Date Noted  . Acute pain of right knee 02/22/2018  . Sprain of right knee 02/22/2018  . Uterine leiomyoma 05/02/2017    Past Surgical History:  Procedure Laterality Date  . CESAREAN SECTION    . CESAREAN SECTION  04/14/2011   Procedure:  CESAREAN SECTION;  Surgeon: Frederico Hamman, MD;  Location: [facility];  Service: Gynecology;  Laterality: N/A;  Repeat of baby girl at 2300 APGAR 2/7    OB History    Gravida  3   Para  2   Term  1   Preterm  1   AB  1   Living  2     SAB  1   TAB  0   Ectopic  0   Multiple  0   Live Births  1            Home Medications    Prior to Admission medications   Medication Sig Start Date End Date Taking? Authorizing Provider  cephALEXin (KEFLEX) 500 MG capsule Take 1 capsule (500 mg total) by mouth 2 (two) times daily. Patient not taking: Reported on 12/31/2018 11/23/18   Benjamine Mola, FNP  cholecalciferol (VITAMIN D3) 25 MCG (1000 UT) tablet Take 1,000 Units by mouth 2 (two) times daily.    [provider]  fluconazole (DIFLUCAN) 150 MG tablet Take 1 tablet (150 mg total) by mouth every three (3) days as needed. Patient not taking: Reported on 12/31/2018 11/26/18   Sharion Balloon, FNP  Multiple Vitamins-Minerals (MULTIVITAMIN WITH MINERALS) tablet Take 1 tablet by mouth daily.    [provider]  QUEtiapine (SEROQUEL) 25 MG tablet  Take 1-2 tablets (25-50 mg total) by mouth at bedtime. 01/05/19   Forrest Moron, MD  vitamin E (VITAMIN E) 1000 UNIT capsule Take 1,000 Units by mouth daily. Takes when she remembers but not daily    [provider]    Family History Family History  Problem Relation Age of Onset  . Diabetes Mother   . Hypertension Mother   . Cancer Father   . Diabetes Father     Social History Social History   Tobacco Use  . Smoking status: Never Smoker  . Smokeless tobacco: Never Used  Substance Use Topics  . Alcohol use: Yes    Alcohol/week: 1.0 standard drinks    Types: 1 Glasses of wine per week    Comment: SOCIALLY  . Drug use: No     Allergies   Macrobid [nitrofurantoin macrocrystal] and Vicodin [hydrocodone-acetaminophen]   Review of Systems As per HPI   Physical Exam Triage Vital Signs ED  Triage Vitals  Enc Vitals Group     BP 01/07/19 1615 (!) 161/95     Pulse Rate 01/07/19 1615 (!) 101     Resp 01/07/19 1615 20     Temp 01/07/19 1615 98 F (36.7 C)     Temp src --      SpO2 01/07/19 1615 100 %     Weight --      Height --      Head Circumference --      Peak Flow --      Pain Score 01/07/19 1616 0     Pain Loc --      Pain Edu? --      Excl. in [place]? --    No data found.  Updated Vital Signs BP (!) 134/93   Pulse (!) 101   Temp 98 F (36.7 C)   Resp 20   LMP 01/04/2019   SpO2 100%   Visual Acuity Right Eye Distance:   Left Eye Distance:   Bilateral Distance:    Right Eye Near:   Left Eye Near:    Bilateral Near:     Physical Exam Constitutional:      General: She is not in acute distress. HENT:     Head: Normocephalic and atraumatic.  Eyes:     General: No scleral icterus.    Pupils: Pupils are equal, round, and reactive to light.  Cardiovascular:     Rate and Rhythm: Normal rate and regular rhythm.     Heart sounds: Normal heart sounds.     Comments: Manual heart rate by examiner: 90 bpm Pulmonary:     Effort: Pulmonary effort is normal.  Skin:    Coloration: Skin is not jaundiced or pale.  Neurological:     Mental Status: She is alert and oriented to person, place, and time.      UC Treatments / Results  Labs (all labs ordered are listed, but only abnormal results are displayed) Labs Reviewed - No data to display  EKG None  Radiology No results found.  Procedures Procedures (including critical care time)  Medications Ordered in UC Medications - No data to display  Initial Impression / Assessment and Plan / UC Course  I have reviewed the triage vital signs and the nursing notes.  Pertinent labs & imaging results that were available during my care of the patient were reviewed by me and considered in my medical decision making (see chart for details).     Patient with history of chronic  fatigue presenting for acute  follow-up and for questions regarding mildly elevated WBC count on recent labs done by PCP.  Patient also requesting comprehensive lab work at this time.  And appears well, hemodynamically stable.  Discussed importance of following up with PCP, and the specific, conference of lab work for nonacute concerns in the urgent care setting is inappropriate could lead to poor quality care due to lack of continuity.  Patient verbalized understanding, was reassured and states that she will follow-up with her PCP.  Reviewed return precautions, patient verbalized understanding. Final Clinical Impressions(s) / UC Diagnoses   Final diagnoses:  Chronic fatigue     Discharge Instructions     REPEAT 134/93 Follow up with PCP regarding additional that you mentioned in our appointment.    ED Prescriptions    None     Controlled Substance Prescriptions [place] Controlled Substance Registry consulted? Not Applicable   Quincy Sheehan, [place] 01/08/19 1848

## 2019-01-13 ENCOUNTER — Ambulatory Visit: Payer: No Typology Code available for payment source | Admitting: Family Medicine

## 2019-01-21 NOTE — Progress Notes (Signed)
Lab visit only. 

## 2019-02-10 ENCOUNTER — Telehealth: Payer: No Typology Code available for payment source | Admitting: Nurse Practitioner

## 2019-02-10 DIAGNOSIS — B373 Candidiasis of vulva and vagina: Secondary | ICD-10-CM | POA: Diagnosis not present

## 2019-02-10 DIAGNOSIS — B3731 Acute candidiasis of vulva and vagina: Secondary | ICD-10-CM

## 2019-02-10 MED ORDER — FLUCONAZOLE 150 MG PO TABS: 150.0000 mg | ORAL_TABLET | ORAL | 0 refills | Status: DC | PRN

## 2019-02-10 MED ORDER — NYSTATIN 100000 UNIT/GM EX CREA: 1.0000 "application " | TOPICAL_CREAM | Freq: Two times a day (BID) | CUTANEOUS | 1 refills | Status: DC

## 2019-02-10 NOTE — Progress Notes (Signed)
We are sorry that you are not feeling well. Here is how we plan to help! Based on what you shared with me it looks like you: May have a yeast vaginosis  Vaginosis is an inflammation of the vagina that can result in discharge, itching and pain. The cause is usually a change in the normal balance of vaginal bacteria or an infection. Vaginosis can also result from reduced estrogen levels after menopause.  The most common causes of vaginosis are:   Bacterial vaginosis which results from an overgrowth of one on several organisms that are normally present in your vagina.   Yeast infections which are caused by a naturally occurring fungus called candida.   Vaginal atrophy (atrophic vaginosis) which results from the thinning of the vagina from reduced estrogen levels after menopause.   Trichomoniasis which is caused by a parasite and is commonly transmitted by sexual intercourse.  Factors that increase your risk of developing vaginosis include: Marland Kitchen Medications, such as antibiotics and steroids . Uncontrolled diabetes . Use of hygiene products such as bubble bath, vaginal spray or vaginal deodorant . Douching . Wearing damp or tight-fitting clothing . Using an intrauterine device (IUD) for birth control . Hormonal changes, such as those associated with pregnancy, birth control pills or menopause . Sexual activity . Having a sexually transmitted infection  Your treatment plan is A single Diflucan (fluconazole) 150mg  tablet once and nystatin cream to use topically.  I have electronically sent this prescription into the pharmacy that you have chosen.  Be sure to take all of the medication as directed. Stop taking any medication if you develop a rash, tongue swelling or shortness of breath. Mothers who are breast feeding should consider pumping and discarding their breast milk while on these antibiotics. However, there is no consensus that infant exposure at these doses would be harmful.  Remember that  medication creams can weaken latex condoms. Marland Kitchen   HOME CARE:  Good hygiene may prevent some types of vaginosis from recurring and may relieve some symptoms:  . Avoid baths, hot tubs and whirlpool spas. Rinse soap from your outer genital area after a shower, and dry the area well to prevent irritation. Don't use scented or harsh soaps, such as those with deodorant or antibacterial action. Marland Kitchen Avoid irritants. These include scented tampons and pads. . Wipe from front to back after using the toilet. Doing so avoids spreading fecal bacteria to your vagina.  Other things that may help prevent vaginosis include:  Marland Kitchen Don't douche. Your vagina doesn't require cleansing other than normal bathing. Repetitive douching disrupts the normal organisms that reside in the vagina and can actually increase your risk of vaginal infection. Douching won't clear up a vaginal infection. . Use a latex condom. Both female and female latex condoms may help you avoid infections spread by sexual contact. . Wear cotton underwear. Also wear pantyhose with a cotton crotch. If you feel comfortable without it, skip wearing underwear to bed. Yeast thrives in [employer] Your symptoms should improve in the next day or two.  GET HELP RIGHT AWAY IF:  . You have pain in your lower abdomen ( pelvic area or over your ovaries) . You develop nausea or vomiting . You develop a fever . Your discharge changes or worsens . You have persistent pain with intercourse . You develop shortness of breath, a rapid pulse, or you faint.  These symptoms could be signs of problems or infections that need to be evaluated by a medical provider now.  MAKE SURE YOU    Understand these instructions.  Will watch your condition.  Will get help right away if you are not doing well or get worse.  Your e-visit answers were reviewed by a board certified advanced clinical practitioner to complete your personal care plan. Depending upon the  condition, your plan could have included both over the counter or prescription medications. Please review your pharmacy choice to make sure that you have choses a pharmacy that is open for you to pick up any needed prescription, Your safety is important to [place]. If you have drug allergies check your prescription carefully.   You can use MyChart to ask questions about today's visit, request a non-urgent call back, or ask for a work or school excuse for 24 hours related to this e-Visit. If it has been greater than 24 hours you will need to follow up with your provider, or enter a new e-Visit to address those concerns. You will get a MyChart message within the next two days asking about your experience. I hope that your e-visit has been valuable and will speed your recovery.  5-10 minutes spent reviewing and documenting in chart.

## 2019-03-18 ENCOUNTER — Telehealth: Payer: No Typology Code available for payment source | Admitting: Family

## 2019-03-18 DIAGNOSIS — B373 Candidiasis of vulva and vagina: Secondary | ICD-10-CM

## 2019-03-18 DIAGNOSIS — B3731 Acute candidiasis of vulva and vagina: Secondary | ICD-10-CM

## 2019-03-18 NOTE — Progress Notes (Signed)
Based on what you shared with me, I feel your condition warrants further evaluation and I recommend that you be seen for a face to face office visit.  It looks like you were recently treated for a yeast infection on 02/10/19. Since your symptoms have returned, you need to be seen face to face for further testing.    NOTE: If you entered your credit card information for this eVisit, you will not be charged. You may see a "hold" on your card for the $35 but that hold will drop off and you will not have a charge processed.  If you are having a true medical emergency please call 911.     For an urgent face to face visit, [facility] has four urgent care centers for your convenience:   . [facility]    [phone]                  Get Driving Directions  [phone] [place], [place] [address] . 10 am to 8 pm Monday-Friday . 12 pm to 8 pm Saturday-Sunday   . [facility] [place]                  Get Driving Directions  [phone] [address], [place], [place] [address] . 8 am to 8 pm Monday-Friday . 9 am to 6 pm Saturday . 11 am to 6 pm Sunday   . [facility] at [place]                  Get Driving Directions   [address] [place], [place] [address] . 8 am to 8 pm Monday-Friday . 8 am to 4 pm Saturday-Sunday    . [facility] at [place]                    Get Driving Directions  [id number]  [address], [place], [place] [address]  . Monday-Friday, 12 PM to 6 PM    Your e-visit answers were reviewed by a board certified advanced clinical practitioner to complete your personal care plan.  Thank you for using e-Visits.

## 2019-05-01 ENCOUNTER — Telehealth: Payer: No Typology Code available for payment source | Admitting: Physician Assistant

## 2019-05-01 DIAGNOSIS — N3000 Acute cystitis without hematuria: Secondary | ICD-10-CM

## 2019-05-01 MED ORDER — CEPHALEXIN 500 MG PO CAPS: 500.0000 mg | ORAL_CAPSULE | Freq: Two times a day (BID) | ORAL | 0 refills | Status: DC

## 2019-05-01 NOTE — Progress Notes (Signed)
We are sorry that you are not feeling well.  Here is how we plan to help!  Based on what you shared with me it looks like you most likely have a simple urinary tract infection.  A UTI (Urinary Tract Infection) is a bacterial infection of the bladder.  Most cases of urinary tract infections are simple to treat but a key part of your care is to encourage you to drink plenty of fluids and watch your symptoms carefully.  I have prescribed Keflex 500 mg 2 times daily for 5 days.  Your symptoms should gradually improve. Call us if the burning in your urine worsens, you develop worsening fever, back pain or pelvic pain or if your symptoms do not resolve after completing the antibiotic.  Urinary tract infections can be prevented by drinking plenty of water to keep your body hydrated.  Also be sure when you wipe, wipe from front to back and don't hold it in!  If possible, empty your bladder every 4 hours.  Your e-visit answers were reviewed by a board certified advanced clinical practitioner to complete your personal care plan.  Depending on the condition, your plan could have included both over the counter or prescription medications.  If there is a problem please reply  once you have received a response from your provider.  Your safety is important to [place].  If you have drug allergies check your prescription carefully.    You can use MyChart to ask questions about today's visit, request a non-urgent call back, or ask for a work or school excuse for 24 hours related to this e-Visit. If it has been greater than 24 hours you will need to follow up with your provider, or enter a new e-Visit to address those concerns.   You will get an e-mail in the next two days asking about your experience.  I hope that your e-visit has been valuable and will speed your recovery. Thank you for using e-visits.  Greater than 5 minutes, yet less than 10 minutes of time have been spent researching, coordinating, and  implementing care for this patient todaya

## 2019-05-06 ENCOUNTER — Telehealth: Payer: No Typology Code available for payment source | Admitting: Family

## 2019-05-06 DIAGNOSIS — B373 Candidiasis of vulva and vagina: Secondary | ICD-10-CM | POA: Diagnosis not present

## 2019-05-06 DIAGNOSIS — B3731 Acute candidiasis of vulva and vagina: Secondary | ICD-10-CM

## 2019-05-06 MED ORDER — FLUCONAZOLE 150 MG PO TABS: 150.0000 mg | ORAL_TABLET | ORAL | 0 refills | Status: DC

## 2019-05-06 NOTE — Progress Notes (Signed)

## 2019-05-08 ENCOUNTER — Other Ambulatory Visit: Payer: Self-pay

## 2019-05-08 ENCOUNTER — Ambulatory Visit
Admission: EM | Admit: 2019-05-08 | Discharge: 2019-05-08 | Disposition: A | Discharge status: Home / Self Care | Payer: No Typology Code available for payment source | Attending: Physician Assistant | Admitting: Physician Assistant

## 2019-05-08 DIAGNOSIS — N3090 Cystitis, unspecified without hematuria: Secondary | ICD-10-CM | POA: Diagnosis not present

## 2019-05-08 DIAGNOSIS — B9689 Other specified bacterial agents as the cause of diseases classified elsewhere: Secondary | ICD-10-CM

## 2019-05-08 LAB — POCT URINALYSIS DIP (MANUAL ENTRY)
Bilirubin, UA: NEGATIVE
Blood, UA: NEGATIVE
Glucose, UA: NEGATIVE mg/dL
Ketones, POC UA: NEGATIVE mg/dL
Nitrite, UA: NEGATIVE
Protein Ur, POC: NEGATIVE mg/dL
Spec Grav, UA: 1.015 (ref 1.010–1.025)
Urobilinogen, UA: 0.2 E.U./dL
pH, UA: 6.5 (ref 5.0–8.0)

## 2019-05-08 LAB — POCT URINE PREGNANCY: Preg Test, Ur: NEGATIVE

## 2019-05-08 MED ORDER — SULFAMETHOXAZOLE-TRIMETHOPRIM 800-160 MG PO TABS: 1.0000 | ORAL_TABLET | Freq: Two times a day (BID) | ORAL | 0 refills | Status: AC

## 2019-05-08 NOTE — ED Triage Notes (Signed)
Pt. States she started having symptoms last thursday. She had an E-Visit on Friday she started meds then. She has had a hx of UTI's, states she is having ongoing sharp side pain.

## 2019-05-08 NOTE — ED Provider Notes (Signed)
[name]    CSN: [id number] Arrival date & time: 05/08/19  1721      History   Chief Complaint Chief Complaint  Patient presents with  . Urinary Tract Infection    HPI Monica Hendrix is a 43 y.o. female.   43 year old female comes in for continued urinary symptoms after antibiotic.  States she first started to with urinary urgency, frequency, feelings of incomplete voiding.  She did a ED visit 05/01/2019, and was started on Keflex 500 mg twice daily x5 days.  She finished this 2 to 3 days ago, with mild improvement of symptoms but without complaint resolution.  States she did a second ED visit 05/06/2019, as she started having vaginal itching after starting the antibiotic.  She had tried Monistat without relief, and was therefore given Diflucan.  She has not yet been able to pick up the medication, but now having clumping discharge with no spotting or bleeding.  She denies abdominal pain, nausea, vomiting, diarrhea.  Denies fever, chills.  Had some back pain that is related to movement, denies flank pain.  LMP 04/24/2019.  Sexually active with one female partner, no condom use.  No birth control use.  No changes in hygiene product.  Patient does admit to holding urine due to work.  Since symptom onset, she has increased her water intake, as well as green tea intake to try to help with symptoms.      Past Medical History:  Diagnosis Date  . Fibroids   . No pertinent past medical history     Patient Active Problem List   Diagnosis Date Noted  . Acute pain of right knee 02/22/2018  . Sprain of right knee 02/22/2018  . Uterine leiomyoma 05/02/2017    Past Surgical History:  Procedure Laterality Date  . CESAREAN SECTION    . CESAREAN SECTION  04/14/2011   Procedure: CESAREAN SECTION;  Surgeon: Frederico Hamman, MD;  Location: [facility];  Service: Gynecology;  Laterality: N/A;  Repeat of baby girl at 2300 APGAR 2/7    OB History    Gravida  3   Para  2   Term  1    Preterm  1   AB  1   Living  2     SAB  1   TAB  0   Ectopic  0   Multiple  0   Live Births  1            Home Medications    Prior to Admission medications   Medication Sig Start Date End Date Taking? Authorizing Provider  cholecalciferol (VITAMIN D3) 25 MCG (1000 UT) tablet Take 1,000 Units by mouth 2 (two) times daily.    [provider]  fluconazole (DIFLUCAN) 150 MG tablet Take 1 tablet (150 mg total) by mouth every 3 (three) days. 05/06/19   Sharion Balloon, FNP  Multiple Vitamins-Minerals (MULTIVITAMIN WITH MINERALS) tablet Take 1 tablet by mouth daily.    [provider]  nystatin cream (MYCOSTATIN) Apply 1 application topically 2 (two) times daily. 02/10/19   Hassell Done Mary-Margaret, FNP  sulfamethoxazole-trimethoprim (BACTRIM DS) 800-160 MG tablet Take 1 tablet by mouth 2 (two) times daily for 3 days. 05/08/19 05/11/19  Tasia Catchings,  V, PA-C  QUEtiapine (SEROQUEL) 25 MG tablet Take 1-2 tablets (25-50 mg total) by mouth at bedtime. 01/05/19 05/08/19  Forrest Moron, MD    Family History Family History  Problem Relation Age of Onset  . Diabetes Mother   .  Hypertension Mother   . Cancer Father   . Diabetes Father     Social History Social History   Tobacco Use  . Smoking status: Never Smoker  . Smokeless tobacco: Never Used  Substance Use Topics  . Alcohol use: Yes    Alcohol/week: 1.0 standard drinks    Types: 1 Glasses of wine per week    Comment: SOCIALLY  . Drug use: No     Allergies   Macrobid [nitrofurantoin macrocrystal] and Vicodin [hydrocodone-acetaminophen]   Review of Systems Review of Systems  Reason unable to perform ROS: See HPI as above.     Physical Exam Triage Vital Signs ED Triage Vitals  Enc Vitals Group     BP 05/08/19 1740 (!) 147/82     Pulse Rate 05/08/19 1740 90     Resp --      Temp 05/08/19 1740 99.2 F (37.3 C)     Temp Source 05/08/19 1740 Oral     SpO2 05/08/19 1740 98 %     Weight  05/08/19 1737 160 lb (72.6 kg)     Height --      Head Circumference --      Peak Flow --      Pain Score 05/08/19 1737 3     Pain Loc --      Pain Edu? --      Excl. in [place]? --    No data found.  Updated Vital Signs BP (!) 147/82 (BP Location: Left Arm)   Pulse 90   Temp 99.2 F (37.3 C) (Oral)   Wt 160 lb (72.6 kg)   LMP 04/24/2019 (Exact Date)   SpO2 98%   BMI 28.35 kg/m   Physical Exam Constitutional:      General: She is not in acute distress.    Appearance: She is well-developed. She is not ill-appearing, toxic-appearing or diaphoretic.  HENT:     Head: Normocephalic and atraumatic.  Eyes:     Conjunctiva/sclera: Conjunctivae normal.     Pupils: Pupils are equal, round, and reactive to light.  Cardiovascular:     Rate and Rhythm: Normal rate and regular rhythm.     Heart sounds: Normal heart sounds. No murmur. No friction rub. No gallop.   Pulmonary:     Effort: Pulmonary effort is normal.     Breath sounds: Normal breath sounds. No wheezing or rales.  Abdominal:     General: Bowel sounds are normal.     Palpations: Abdomen is soft.     Tenderness: There is no abdominal tenderness. There is no right CVA tenderness, left CVA tenderness, guarding or rebound.  Musculoskeletal:     Comments: No tenderness to palpation of the spinous processes.  No tenderness to palpation of right lumbar region.  Full range of motion of back.  Skin:    General: Skin is warm and dry.  Neurological:     Mental Status: She is alert and oriented to person, place, and time.  Psychiatric:        Behavior: Behavior normal.        Judgment: Judgment normal.      UC Treatments / Results  Labs (all labs ordered are listed, but only abnormal results are displayed) Labs Reviewed  POCT URINALYSIS DIP (MANUAL ENTRY) - Abnormal; Notable for the following components:      Result Value   Leukocytes, UA Small (1+) (*)    All other components within normal limits  POCT URINE PREGNANCY -  Normal  URINE CULTURE    EKG   Radiology No results found.  Procedures Procedures (including critical care time)  Medications Ordered in UC Medications - No data to display  Initial Impression / Assessment and Plan / UC Course  I have reviewed the triage vital signs and the nursing notes.  Pertinent labs & imaging results that were available during my care of the patient were reviewed by me and considered in my medical decision making (see chart for details).    Discussed urine dipstick result with patient.  Given without much improvement on Keflex, ?Resistance to Keflex.  However, given small leukocytes, discussed awaiting urine culture prior to treatment.  Patient with significant symptoms, and would like to start on antibiotics.  Will switch to Bactrim at this time due to allergies to Macrobid.  Per patient take Diflucan to cover for yeast.  Push fluids, discussed holding green tea due to caffeine.  Return precautions given.  Patient expresses understanding and agrees to plan.  Final Clinical Impressions(s) / UC Diagnoses   Final diagnoses:  Cystitis    ED Prescriptions    Medication Sig Dispense Auth. Provider   sulfamethoxazole-trimethoprim (BACTRIM DS) 800-160 MG tablet Take 1 tablet by mouth 2 (two) times daily for 3 days. 6 tablet Ok Edwards, PA-C     PDMP not reviewed this encounter.   Ok Edwards, PA-C 05/08/19 [phone]

## 2019-05-08 NOTE — Discharge Instructions (Signed)
Your urine was positive for an urinary tract infection. Start bactrim as directed. Start diflucan as directed to cover for yeast. Urine culture sent. Keep hydrated, urine should be clear to pale yellow in color. Monitor for any worsening of symptoms, fever, worsening abdominal pain, nausea/vomiting, flank pain, follow up for reevaluation.

## 2019-05-12 ENCOUNTER — Encounter: Payer: Self-pay | Admitting: Family Medicine

## 2019-05-12 LAB — URINE CULTURE: Culture: <10000 — AB

## 2019-06-02 ENCOUNTER — Ambulatory Visit (INDEPENDENT_AMBULATORY_CARE_PROVIDER_SITE_OTHER): Payer: No Typology Code available for payment source | Admitting: Family Medicine

## 2019-06-02 ENCOUNTER — Encounter: Payer: Self-pay | Admitting: Family Medicine

## 2019-06-02 ENCOUNTER — Other Ambulatory Visit: Payer: Self-pay

## 2019-06-02 VITALS — BP 121/74 | HR 78 | Temp 99.1°F | Wt 164.4 lb

## 2019-06-02 DIAGNOSIS — D259 Leiomyoma of uterus, unspecified: Secondary | ICD-10-CM | POA: Diagnosis not present

## 2019-06-02 DIAGNOSIS — R109 Unspecified abdominal pain: Secondary | ICD-10-CM

## 2019-06-02 LAB — POCT URINALYSIS DIP (MANUAL ENTRY)
Bilirubin, UA: NEGATIVE
Blood, UA: NEGATIVE
Glucose, UA: NEGATIVE mg/dL
Ketones, POC UA: NEGATIVE mg/dL
Leukocytes, UA: NEGATIVE
Nitrite, UA: NEGATIVE
Protein Ur, POC: NEGATIVE mg/dL
Spec Grav, UA: 1.025 (ref 1.010–1.025)
Urobilinogen, UA: 0.2 E.U./dL
pH, UA: 7 (ref 5.0–8.0)

## 2019-06-02 NOTE — Progress Notes (Signed)
Established Patient Office Visit  Subjective:  Patient ID: Monica Hendrix, female    DOB: 08/18/1975  Age: 43 y.o. MRN: [id number]  CC:  Chief Complaint  Patient presents with  . pelvic pressure and discomfort    pressure and discomfort in the pelvic area. was seen by urgent care and put on abx got a little better then came back and had a virtural and they put me on another round of abx. Still have pressure, Hx of fibroids. Had a pap last year.     HPI Monica Hendrix presents for   Patient reports that she has been having issues with pelvic pressure for eight years and it is intermittent The pressure feels like urgency  She has been evaluated and treated for UTIs She states that she tries to stay hydrated and flushing it out on her own  The pressure increases closer to her periods. She reports that she gets bloating pain and notices that around her period she feels like her abdomen is very distended. She is done with childbearing but is not inclined to pursue surgery.  CLINICAL DATA:  Pelvic pressure.  Evaluate for fibroids.  EXAM: TRANSABDOMINAL AND TRANSVAGINAL ULTRASOUND OF PELVIS  TECHNIQUE: Both transabdominal and transvaginal ultrasound examinations of the pelvis were performed. Transabdominal technique was performed for global imaging of the pelvis including uterus, ovaries, adnexal regions, and pelvic cul-de-sac. It was necessary to proceed with endovaginal exam following the transabdominal exam to visualize the uterus.  COMPARISON:  Ultrasound 01/23/2011.  FINDINGS: Uterus  Measurements: 9.2 x 5.2 x 7.5 cm. Multiple fibroids are noted, the largest measures 3.3 cm in maximum diameter is present in the posterior fundal region. Nabothian cysts noted .  Endometrium  Thickness: 8 mm.  No focal abnormality identified .  Right ovary  Measurements: 2.6 x 1.4 x 2.0 cm. Normal appearance/no adnexal mass.  Left ovary  Measurements: 2.7 x 1.9 x 12.0 cm.  1.5 cm simple cyst.  Other findings  No abnormal free fluid.  IMPRESSION: Fibroid uterus.  Largest fibroid measures 3.3 cm.   Electronically Signed   By: Marcello Moores  Register   On: 04/09/2017 16:15  Past Medical History:  Diagnosis Date  . Fibroids   . No pertinent past medical history     Past Surgical History:  Procedure Laterality Date  . CESAREAN SECTION    . CESAREAN SECTION  04/14/2011   Procedure: CESAREAN SECTION;  Surgeon: Frederico Hamman, MD;  Location: [facility];  Service: Gynecology;  Laterality: N/A;  Repeat of baby girl at 71 APGAR 2/7    Family History  Problem Relation Age of Onset  . Diabetes Mother   . Hypertension Mother   . Cancer Father   . Diabetes Father     Social History   Socioeconomic History  . Marital status: Married    Spouse name: Not on file  . Number of children: 2  . Years of education: Not on file  . Highest education level: Not on file  Occupational History  . Not on file  Social Needs  . Financial resource strain: Not on file  . Food insecurity    Worry: Not on file    Inability: Not on file  . Transportation needs    Medical: Not on file    Non-medical: Not on file  Tobacco Use  . Smoking status: Never Smoker  . Smokeless tobacco: Never Used  Substance and Sexual Activity  . Alcohol use: Yes    Alcohol/week: 1.0 standard  drinks    Types: 1 Glasses of wine per week    Comment: SOCIALLY  . Drug use: No  . Sexual activity: Yes    Birth control/protection: None  Lifestyle  . Physical activity    Days per week: Not on file    Minutes per session: Not on file  . Stress: Not on file  Relationships  . Social Herbalist on phone: Not on file    Gets together: Not on file    Attends religious service: Not on file    Active member of club or organization: Not on file    Attends meetings of clubs or organizations: Not on file    Relationship status: Not on file  . Intimate partner violence    Fear of  current or ex partner: Not on file    Emotionally abused: Not on file    Physically abused: Not on file    Forced sexual activity: Not on file  Other Topics Concern  . Not on file  Social History Narrative  . Not on file    Outpatient Medications Prior to Visit  Medication Sig Dispense Refill  . b complex vitamins tablet Take 1 tablet by mouth daily.    . cholecalciferol (VITAMIN D3) 25 MCG (1000 UT) tablet Take 1,000 Units by mouth 2 (two) times daily.    . Multiple Vitamins-Minerals (MULTIVITAMIN WITH MINERALS) tablet Take 1 tablet by mouth daily.    . vitamin E 1000 UNIT capsule Take 1,000 Units by mouth daily.    . fluconazole (DIFLUCAN) 150 MG tablet Take 1 tablet (150 mg total) by mouth every 3 (three) days. 3 tablet 0  . nystatin cream (MYCOSTATIN) Apply 1 application topically 2 (two) times daily. 30 g 1   No facility-administered medications prior to visit.     Allergies  Allergen Reactions  . Macrobid [Nitrofurantoin Macrocrystal] Hives  . Vicodin [Hydrocodone-Acetaminophen] Itching    ROS Review of Systems Review of Systems  Constitutional: Negative for activity change, appetite change, chills and fever.  HENT: Negative for congestion, nosebleeds, trouble swallowing and voice change.   Respiratory: Negative for cough, shortness of breath and wheezing.   Gastrointestinal: Negative for diarrhea, nausea and vomiting.  Genitourinary: see hpi Musculoskeletal: Negative for back pain, joint swelling and neck pain.  Neurological: Negative for dizziness, speech difficulty, light-headedness and numbness.  See HPI. All other review of systems negative.     Objective:    Physical Exam  BP 121/74 (BP Location: Right Arm, Patient Position: Sitting, Cuff Size: Large)   Pulse 78   Temp 99.1 F (37.3 C) (Oral)   Wt 164 lb 6.4 oz (74.6 kg)   SpO2 98%   BMI 29.13 kg/m  Wt Readings from Last 3 Encounters:  06/02/19 164 lb 6.4 oz (74.6 kg)  05/08/19 160 lb (72.6 kg)   12/31/18 160 lb (72.6 kg)   Physical Exam  Constitutional: Oriented to person, place, and time. Appears well-developed and well-nourished.  HENT:  Head: Normocephalic and atraumatic.  Eyes: Conjunctivae and EOM are normal.  Cardiovascular: Normal rate, regular rhythm, normal heart sounds and intact distal pulses.  No murmur heard. Pulmonary/Chest: Effort normal and breath sounds normal. No stridor. No respiratory distress. Has no wheezes.  Abdomen: nondistended, normoactive bs, soft, nontender Neurological: Is alert and oriented to person, place, and time.  Skin: Skin is warm. Capillary refill takes less than 2 seconds.  Psychiatric: Has a normal mood and affect. Behavior is normal. Judgment  and thought content normal.    There are no preventive care reminders to display for this patient.  There are no preventive care reminders to display for this patient.  Lab Results  Component Value Date   TSH 1.070 01/01/2019   Lab Results  Component Value Date   WBC 11.5 (H) 01/01/2019   HGB 13.0 01/01/2019   HCT 39.0 01/01/2019   MCV 97 01/01/2019   PLT 300 01/01/2019   Lab Results  Component Value Date   NA 139 10/24/2017   K 4.2 10/24/2017   CO2 24 10/24/2017   GLUCOSE 106 (H) 10/24/2017   BUN 10 10/24/2017   CREATININE 0.80 10/24/2017   BILITOT 0.6 10/24/2017   ALKPHOS 68 10/24/2017   AST 23 10/24/2017   ALT 17 10/24/2017   PROT 7.4 10/24/2017   ALBUMIN 4.4 10/24/2017   CALCIUM 9.1 10/24/2017   Lab Results  Component Value Date   CHOL 184 10/24/2017   Lab Results  Component Value Date   HDL 79 10/24/2017   Lab Results  Component Value Date   LDLCALC 99 10/24/2017   Lab Results  Component Value Date   TRIG 31 10/24/2017   Lab Results  Component Value Date   CHOLHDL 2.3 10/24/2017   Lab Results  Component Value Date   HGBA1C 5.4 10/24/2017      Assessment & Plan:   Problem List Items Addressed This Visit      Genitourinary   Uterine leiomyoma     Other Visit Diagnoses    Abdominal pressure    -  Primary   Relevant Orders   POCT urinalysis dipstick (Completed)     No UTI today Likely her uterus is pressing on the bladder and causing overactive bladder symptoms She should discuss options with Gynecology and get a repeat US to see if the fibroid has increased in size Discussed minimally invasive procedures vs. Contraception to prevent ovulation She will follow up with her Gyne Dr. Charlesetta Garibaldi.   No orders of the defined types were placed in this encounter.   Follow-up: Return if symptoms worsen or fail to improve.   A total of 25 minutes were spent face-to-face with the patient during this encounter and over half of that time was spent on counseling and coordination of care.   Forrest Moron, MD

## 2019-06-02 NOTE — Patient Instructions (Addendum)
If you have lab work done today you will be contacted with your lab results within the next 2 weeks.  If you have not heard from [place] then please contact us. The fastest way to get your results is to register for My Chart.   IF you received an x-ray today, you will receive an invoice from [facility]. Please contact [facility] at [phone] with questions or concerns regarding your invoice.   IF you received labwork today, you will receive an invoice from [facility]. Please contact [facility] at [phone] with questions or concerns regarding your invoice.   Our billing staff will not be able to assist you with questions regarding bills from these companies.  You will be contacted with the lab results as soon as they are available. The fastest way to get your results is to activate your My Chart account. Instructions are located on the last page of this paperwork. If you have not heard from [place] regarding the results in 2 weeks, please contact this office.      Myomectomy  Myomectomy is a surgery in which a non-cancerous fibroid (myoma) is removed from the uterus. Myomas are tumors made up of fibrous tissue. They are often called fibroid tumors. Fibroid tumors can range from the size of a pea to the size of a grapefruit. In a myomectomy, the fibroid tumor is removed without removing the uterus. Because these tumors are rarely cancerous, this surgery is usually done only if the tumor is growing or causing symptoms such as pain, pressure, bleeding, or pain with intercourse. Tell a health care provider about:  Any allergies you have.  All medicines you are taking, including vitamins, herbs, eye drops, creams, and over-the-counter medicines.  Any problems you or family members have had with anesthetic medicines.  Any blood disorders you have.  Any surgeries you have had.  Any medical conditions you have. What are the risks? Generally, this is a safe procedure.  However, problems may occur, including:  Bleeding.  Infection.  Allergic reactions to medicines.  Damage to other structures or organs.  Blood clots in the legs, chest, or brain.  Scar tissue on other organs and in the pelvis. This may require another surgery to remove the scar tissue. What happens before the procedure? Staying hydrated Follow instructions from your health care provider about hydration, which may include:  Up to 2 hours before the procedure - you may continue to drink clear liquids, such as water, clear fruit juice, black coffee, and plain tea. Eating and drinking restrictions Follow instructions from your health care provider about eating and drinking, which may include:  8 hours before the procedure - stop eating heavy meals or foods such as meat, fried foods, or fatty foods.  6 hours before the procedure - stop eating light meals or foods, such as toast or cereal.  6 hours before the procedure - stop drinking milk or drinks that contain milk.  2 hours before the procedure - stop drinking clear liquids. General instructions  Ask your health care provider about: ? Changing or stopping your regular medicines. This is especially important if you are taking diabetes medicines or blood thinners. ? Taking medicines such as aspirin and ibuprofen. These medicines can thin your blood. Do not take these medicines before your procedure if your health care provider instructs you not to.  Do not drink alcohol the day before the surgery.  Do not use any products that contain nicotine or tobacco, such as  cigarettes and e-cigarettes, for 2 weeks before the procedure. If you need help quitting, ask your health care provider.  Plan to have someone take you home from the hospital or clinic. Also arrange for someone to help you with activities during your recovery. What happens during the procedure?  To reduce your risk of infection: ? Your health care team will wash or  sanitize their hands. ? Your skin will be washed with soap. ? Hair may be removed from the surgical area.  An IV tube will be inserted into one of your veins. Medicines will be able to flow directly into your body through this IV tube.  You will be given one or more of the following: ? A medicine to help you relax (sedative). ? A medicine to make you fall asleep (general anesthetic).  Small monitors will be attached to your body. They will be used to check your heart, blood pressure, and oxygen level.  A breathing tube will be placed into your lungs during the procedure.  A thin, flexible tube (catheter) will be inserted into your bladder to collect urine.  Your surgeon will use one of the following methods to perform the procedure. The method used will depend on the size, shape, location, and number of fibroids. Hysteroscopic myomectomy This method may be used when the fibroid tumor is inside the cavity of the uterus. A long, thin tube with a lens (hysteroscope) will be inserted into the uterus through the vagina. A saline solution will be put into the uterus. This will expand the uterus and allow the surgeon to see the fibroids. Tools will be passed through the hysteroscope to remove the fibroid tumor in pieces. Laparoscopic myomectomy A few small incisions will be made in the lower abdomen. A thin, lighted tube with a camera (laparoscope) will be inserted through one of the incisions. This will give the surgeon a good view of the area. The fibroid tumor will be removed through the other incisions. The incisions will then be closed with stitches (sutures) or staples. Abdominal myomectomy This method is used when the fibroid tumor cannot be removed with a hysteroscope or laparoscope. The surgery will be done through a larger surgical incision in the abdomen. The fibroid tumor will be removed through this incision. The incision will be closed with sutures or staples. Recovery time will be  longer if this method is used. The procedures may vary among health care providers and hospitals. What happens after the procedure?  Your blood pressure, heart rate, breathing rate, and blood oxygen level will be monitored until the medicines you were given have worn off.  The IV access tube and catheter will remain on your body for a period of time.  You may be given medicine for pain or to help you sleep.  You may be given an antibiotic medicine if needed.  Do not drive for 24 hours if you were given a sedative. Summary  Myomectomy is surgery to remove a noncancerous fibroid (myoma) from the uterus.  This surgery is usually done only if the tumor is growing or causing symptoms such as pain, pressure, bleeding, or pain during intercourse.  Follow instructions from your health care provider about eating and drinking before the procedure.  Recovery time from this procedure depends on the method. The abdominal method will require a longer recovery time. This information is not intended to replace advice given to you by your health care provider. Make sure you discuss any questions you have with your  health care provider. Document Released: 04/29/2007 Document Revised: 10/24/2018 Document Reviewed: 08/02/2016 Elsevier Patient Education  2020 [employer].

## 2019-10-29 ENCOUNTER — Inpatient Hospital Stay (HOSPITAL_COMMUNITY)
Admission: EM | Admit: 2019-10-29 | Discharge: 2019-11-06 | DRG: 392 | Disposition: A | Discharge status: Home / Self Care | Payer: No Typology Code available for payment source | Attending: General Surgery | Admitting: General Surgery

## 2019-10-29 ENCOUNTER — Encounter (HOSPITAL_COMMUNITY): Payer: Self-pay | Admitting: Urgent Care

## 2019-10-29 ENCOUNTER — Ambulatory Visit (HOSPITAL_COMMUNITY)
Admission: EM | Admit: 2019-10-29 | Discharge: 2019-10-29 | Disposition: A | Discharge status: Home / Self Care | Payer: No Typology Code available for payment source | Source: Home / Self Care

## 2019-10-29 ENCOUNTER — Other Ambulatory Visit: Payer: Self-pay

## 2019-10-29 ENCOUNTER — Encounter (HOSPITAL_COMMUNITY): Payer: Self-pay

## 2019-10-29 DIAGNOSIS — K5720 Diverticulitis of large intestine with perforation and abscess without bleeding: Principal | ICD-10-CM | POA: Diagnosis present

## 2019-10-29 DIAGNOSIS — R1030 Lower abdominal pain, unspecified: Secondary | ICD-10-CM | POA: Insufficient documentation

## 2019-10-29 DIAGNOSIS — D259 Leiomyoma of uterus, unspecified: Secondary | ICD-10-CM | POA: Diagnosis present

## 2019-10-29 DIAGNOSIS — Z20822 Contact with and (suspected) exposure to covid-19: Secondary | ICD-10-CM | POA: Diagnosis present

## 2019-10-29 DIAGNOSIS — Z800 Family history of malignant neoplasm of digestive organs: Secondary | ICD-10-CM

## 2019-10-29 DIAGNOSIS — Z885 Allergy status to narcotic agent status: Secondary | ICD-10-CM

## 2019-10-29 DIAGNOSIS — R102 Pelvic and perineal pain: Secondary | ICD-10-CM | POA: Insufficient documentation

## 2019-10-29 DIAGNOSIS — Z881 Allergy status to other antibiotic agents status: Secondary | ICD-10-CM

## 2019-10-29 DIAGNOSIS — Z8249 Family history of ischemic heart disease and other diseases of the circulatory system: Secondary | ICD-10-CM

## 2019-10-29 DIAGNOSIS — Z3202 Encounter for pregnancy test, result negative: Secondary | ICD-10-CM

## 2019-10-29 DIAGNOSIS — Z833 Family history of diabetes mellitus: Secondary | ICD-10-CM

## 2019-10-29 LAB — POC URINE PREG, ED: Preg Test, Ur: NEGATIVE

## 2019-10-29 LAB — CBC
HCT: 39.6 % (ref 36.0–46.0)
Hemoglobin: 13.8 g/dL (ref 12.0–15.0)
MCH: 34.2 pg — ABNORMAL HIGH (ref 26.0–34.0)
MCHC: 34.8 g/dL (ref 30.0–36.0)
MCV: 98 fL (ref 80.0–100.0)
Platelets: 312 10*3/uL (ref 150–400)
RBC: 4.04 MIL/uL (ref 3.87–5.11)
RDW: 11.8 % (ref 11.5–15.5)
WBC: 19.2 10*3/uL — ABNORMAL HIGH (ref 4.0–10.5)
nRBC: 0 % (ref 0.0–0.2)

## 2019-10-29 LAB — COMPREHENSIVE METABOLIC PANEL
ALT: 17 U/L (ref 0–44)
AST: 20 U/L (ref 15–41)
Albumin: 4.2 g/dL (ref 3.5–5.0)
Alkaline Phosphatase: 63 U/L (ref 38–126)
Anion gap: 8 (ref 5–15)
BUN: 13 mg/dL (ref 6–20)
CO2: 25 mmol/L (ref 22–32)
Calcium: 8.7 mg/dL — ABNORMAL LOW (ref 8.9–10.3)
Chloride: 105 mmol/L (ref 98–111)
Creatinine, Ser: 0.78 mg/dL (ref 0.44–1.00)
GFR calc Af Amer: >60 mL/min (ref >60)
GFR calc non Af Amer: >60 mL/min (ref >60)
Glucose, Bld: 133 mg/dL — ABNORMAL HIGH (ref 70–99)
Potassium: 4.6 mmol/L (ref 3.5–5.1)
Sodium: 138 mmol/L (ref 135–145)
Total Bilirubin: 0.9 mg/dL (ref 0.3–1.2)
Total Protein: 7.5 g/dL (ref 6.5–8.1)

## 2019-10-29 LAB — URINALYSIS, ROUTINE W REFLEX MICROSCOPIC
Bilirubin Urine: NEGATIVE
Glucose, UA: NEGATIVE mg/dL
Hgb urine dipstick: NEGATIVE
Ketones, ur: 5 mg/dL — AB
Leukocytes,Ua: NEGATIVE
Nitrite: NEGATIVE
Protein, ur: NEGATIVE mg/dL
Specific Gravity, Urine: 1.025 (ref 1.005–1.030)
pH: 6 (ref 5.0–8.0)

## 2019-10-29 LAB — POCT URINALYSIS DIP (DEVICE)
Bilirubin Urine: NEGATIVE
Glucose, UA: NEGATIVE mg/dL
Hgb urine dipstick: NEGATIVE
Leukocytes,Ua: NEGATIVE
Nitrite: NEGATIVE
Protein, ur: NEGATIVE mg/dL
Specific Gravity, Urine: 1.025 (ref 1.005–1.030)
Urobilinogen, UA: 0.2 mg/dL (ref 0.0–1.0)
pH: 6.5 (ref 5.0–8.0)

## 2019-10-29 LAB — LIPASE, BLOOD: Lipase: 25 U/L (ref 11–51)

## 2019-10-29 LAB — I-STAT BETA HCG BLOOD, ED (MC, WL, AP ONLY): I-stat hCG, quantitative: <5 m[IU]/mL (ref <5)

## 2019-10-29 LAB — POCT PREGNANCY, URINE: Preg Test, Ur: NEGATIVE

## 2019-10-29 MED ORDER — KETOROLAC TROMETHAMINE 60 MG/2ML IM SOLN
INTRAMUSCULAR | Status: AC
  Filled 2019-10-29: qty 2

## 2019-10-29 MED ORDER — ONDANSETRON HCL 4 MG/2ML IJ SOLN
4.0000 mg | Freq: Once | INTRAMUSCULAR | Status: DC | PRN
  Filled 2019-10-29: qty 2

## 2019-10-29 MED ORDER — SODIUM CHLORIDE 0.9% FLUSH: 3.0000 mL | Freq: Once | INTRAVENOUS | Status: DC

## 2019-10-29 MED ORDER — FENTANYL CITRATE (PF) 100 MCG/2ML IJ SOLN
75.0000 ug | Freq: Once | INTRAMUSCULAR | Status: DC
  Filled 2019-10-29: qty 2

## 2019-10-29 MED ORDER — SODIUM CHLORIDE 0.9 % IV BOLUS
1000.0000 mL | Freq: Once | INTRAVENOUS | Status: AC
  Administered 2019-10-29: 1000 mL via INTRAVENOUS

## 2019-10-29 MED ORDER — ONDANSETRON 4 MG PO TBDP: 4.0000 mg | ORAL_TABLET | Freq: Once | ORAL | Status: DC | PRN

## 2019-10-29 MED ORDER — KETOROLAC TROMETHAMINE 60 MG/2ML IM SOLN
60.0000 mg | Freq: Once | INTRAMUSCULAR | Status: AC
  Administered 2019-10-29: 21:00:00 60 mg via INTRAMUSCULAR

## 2019-10-29 NOTE — ED Provider Notes (Signed)
[facility] DEPT Provider Note   CSN: [id number] Arrival date & time: 10/29/19  2054     History Chief Complaint  Patient presents with  . Abdominal Pain    Monica Hendrix is a 44 y.o. female with a history of uterine leiomyoma who presents to the emergency department from UC with a chief complaint of abdominal pain.  The patient endorses constant, worsening bilateral lower abdominal pain that began suddenly around 17:00 when she was leaving work.  She reports that the pain initially began much lower, in her pelvic region, and gradually radiated upward.  Pain was initially 10 out of 10 and cramping in nature.  She states that the pain was worse than labor pains.  She voided and had a couple of normal bowel movements after the pain began, but there was no improvement of her symptoms so she went to urgent care.  She had a negative pregnancy test and a urinalysis that was unremarkable and was sent to the emergency department for further work-up and evaluation.  She was given Toradol at urgent care and her pain is improved to 8 out of 10.  No fever, chills, nausea, vomiting, diarrhea, constipation, vaginal pain or discharge, dysuria, hematuria, cough, shortness of breath.  She reports that she has been trying to increase the amount of vegetables in her diet, but no other dietary changes.  No recent travel.  No known sick contacts.  Surgical history includes cesarean section x2.  No recent prescriptions of corticosteroids.  The history is provided by the patient. No language interpreter was used.       Past Medical History:  Diagnosis Date  . Fibroids   . Infection of cesarean section or perineal wound 04/24/2011  . No pertinent past medical history     Patient Active Problem List   Diagnosis Date Noted  . Perforation of sigmoid colon due to diverticulitis 10/30/2019  . Family history of colon cancer in father 10/30/2019  . Acute pain of right knee  02/22/2018  . Sprain of right knee 02/22/2018  . Uterine leiomyoma 05/02/2017  . Acute posthemorrhagic anemia 04/16/2011  . Multigravida of advanced maternal age 09/23/2010    Past Surgical History:  Procedure Laterality Date  . CESAREAN SECTION    . CESAREAN SECTION  04/14/2011   Procedure: CESAREAN SECTION;  Surgeon: Frederico Hamman, MD;  Location: [facility];  Service: Gynecology;  Laterality: N/A;  Repeat of baby girl at 2300 APGAR 2/7     OB History    Gravida  3   Para  2   Term  1   Preterm  1   AB  1   Living  2     SAB  1   TAB  0   Ectopic  0   Multiple  0   Live Births  1           Family History  Problem Relation Age of Onset  . Diabetes Mother   . Hypertension Mother   . Cancer Father   . Diabetes Father     Social History   Tobacco Use  . Smoking status: Never Smoker  . Smokeless tobacco: Never Used  Substance Use Topics  . Alcohol use: Yes    Alcohol/week: 1.0 standard drinks    Types: 1 Glasses of wine per week    Comment: SOCIALLY  . Drug use: No    Home Medications Prior to Admission medications   Medication Sig Start Date  End Date Taking? Authorizing Provider  b complex vitamins tablet Take 1 tablet by mouth daily.   Yes [provider]  cholecalciferol (VITAMIN D3) 25 MCG (1000 UT) tablet Take 1,000 Units by mouth 2 (two) times daily.   Yes [provider]  Multiple Vitamins-Minerals (MULTIVITAMIN WITH MINERALS) tablet Take 1 tablet by mouth daily.   Yes [provider]  QUEtiapine (SEROQUEL) 25 MG tablet Take 1-2 tablets (25-50 mg total) by mouth at bedtime. 01/05/19 05/08/19  Forrest Moron, MD    Allergies    Macrobid [nitrofurantoin macrocrystal], Vicodin [hydrocodone-acetaminophen], and Hydrocodone-acetaminophen  Review of Systems   Review of Systems  Constitutional: Negative for activity change, chills and fever.  Respiratory: Negative for shortness of breath.   Cardiovascular: Negative  for chest pain.  Gastrointestinal: Positive for abdominal pain. Negative for constipation, diarrhea, nausea and vomiting.  Genitourinary: Negative for dysuria, enuresis, flank pain, frequency, hematuria, urgency, vaginal bleeding, vaginal discharge and vaginal pain.  Musculoskeletal: Negative for back pain, myalgias and neck pain.  Skin: Negative for rash.  Allergic/Immunologic: Negative for immunocompromised state.  Neurological: Negative for seizures, syncope, weakness and headaches.  Psychiatric/Behavioral: Negative for confusion.    Physical Exam Updated Vital Signs BP (!) 142/84   Pulse (!) 119   Temp 98.5 F (36.9 C) (Oral)   Resp 15   Ht 5\' 2"  (1.575 m)   Wt 72.6 kg   LMP 10/04/2019 (Approximate) Comment: negative HCG 10/29/19  SpO2 100%   BMI 29.26 kg/m   Physical Exam Vitals and nursing note reviewed.  Constitutional:      General: She is not in acute distress.    Comments: Nontoxic-appearing.  HENT:     Head: Normocephalic.  Eyes:     Conjunctiva/sclera: Conjunctivae normal.  Cardiovascular:     Rate and Rhythm: Normal rate and regular rhythm.     Pulses: Normal pulses.     Heart sounds: Normal heart sounds. No murmur. No friction rub. No gallop.   Pulmonary:     Effort: Pulmonary effort is normal. No respiratory distress.     Breath sounds: No stridor. No wheezing, rhonchi or rales.  Chest:     Chest wall: No tenderness.  Abdominal:     General: Bowel sounds are normal. There is no distension.     Palpations: Abdomen is soft. There is no mass.     Tenderness: There is abdominal tenderness. There is guarding. There is no right CVA tenderness, left CVA tenderness or rebound.     Hernia: No hernia is present.     Comments: Tender to palpation of the bilateral lower abdomen.  Maximal tenderness is in the right lower quadrant.  She is tender over McBurney's point.  She has voluntary guarding, but no rebound.  Positive Rovsing sign.  Normoactive bowel sounds in all  4 quadrants.  Abdomen is soft and nondistended.  Negative Murphy sign.  No CVA tenderness bilaterally.  Musculoskeletal:     Cervical back: Neck supple.     Right lower leg: No edema.     Left lower leg: No edema.  Skin:    General: Skin is warm.     Findings: No rash.  Neurological:     Mental Status: She is alert.  Psychiatric:        Behavior: Behavior normal.     ED Results / Procedures / Treatments   Labs (all labs ordered are listed, but only abnormal results are displayed) Labs Reviewed  COMPREHENSIVE METABOLIC PANEL -  Abnormal; Notable for the following components:      Result Value   Glucose, Bld 133 (*)    Calcium 8.7 (*)    All other components within normal limits  CBC - Abnormal; Notable for the following components:   WBC 19.2 (*)    MCH 34.2 (*)    All other components within normal limits  URINALYSIS, ROUTINE W REFLEX MICROSCOPIC - Abnormal; Notable for the following components:   Ketones, ur 5 (*)    All other components within normal limits  RESPIRATORY PANEL BY RT PCR (FLU A&B, COVID)  LIPASE, BLOOD  I-STAT BETA HCG BLOOD, ED ([facility], [facility], [facility] ONLY)    EKG None  Radiology CT ABDOMEN PELVIS W CONTRAST  Result Date: 10/30/2019 CLINICAL DATA:  Sudden onset lower abdominal pain for 2 hours EXAM: CT ABDOMEN AND PELVIS WITH CONTRAST TECHNIQUE: Multidetector CT imaging of the abdomen and pelvis was performed using the standard protocol following bolus administration of intravenous contrast. CONTRAST:  143mL OMNIPAQUE IOHEXOL 300 MG/ML  SOLN COMPARISON:  None. FINDINGS: Lower chest: No acute pleural or parenchymal lung disease. Hepatobiliary: No focal liver abnormality is seen. No gallstones, gallbladder wall thickening, or biliary dilatation. Pancreas: Unremarkable. No pancreatic ductal dilatation or surrounding inflammatory changes. Spleen: Normal in size without focal abnormality. Adrenals/Urinary Tract: Adrenal glands are unremarkable. Kidneys are normal, without  renal calculi, focal lesion, or hydronephrosis. Bladder is unremarkable. Stomach/Bowel: There is abnormal wall thickening of the mid sigmoid colon, with surrounding pericolonic fat stranding. Findings are consistent with acute diverticulitis. There is free gas within the peritoneal cavity consistent with perforated diverticulitis. No fluid collection or abscess. No bowel obstruction or ileus. Normal appendix right lower quadrant. Vascular/Lymphatic: No significant vascular findings are present. No enlarged abdominal or pelvic lymph nodes. Reproductive: Uterus is enlarged, with multiple uterine fibroids. No adnexal masses. Other: There is a small amount of free fluid within the cul-de-sac. No fluid collection or abscess. Pneumoperitoneum is seen throughout the abdomen. Musculoskeletal: No acute or destructive bony lesions. Reconstructed images demonstrate no additional findings. IMPRESSION: 1. Acute sigmoid diverticulitis. There is evidence of perforation, with pneumoperitoneum throughout the abdomen and pelvis. No fluid collection or abscess. 2. Trace pelvic free fluid. 3. Fibroid uterus. These results were called by telephone at the time of interpretation on 10/30/2019 at 12:38 am to provider Shakaria Raphael [facility] , who verbally acknowledged these results. Electronically Signed   By: Randa Ngo M.D.   On: 10/30/2019 00:39    Procedures Procedures (including critical care time)  Medications Ordered in ED Medications  sodium chloride flush (NS) 0.9 % injection 3 mL (has no administration in time range)  fentaNYL (SUBLIMAZE) injection 75 mcg (0 mcg Intravenous Hold 10/29/19 2336)  ondansetron (ZOFRAN) injection 4 mg (has no administration in time range)  sodium chloride (PF) 0.9 % injection (has no administration in time range)  piperacillin-tazobactam (ZOSYN) IVPB 3.375 g (3.375 g Intravenous New Bag/Given 10/30/19 0119)  sodium chloride 0.9 % bolus 1,000 mL (0 mLs Intravenous Stopped 10/30/19 0123)  iohexol  (OMNIPAQUE) 300 MG/ML solution 100 mL (100 mLs Intravenous Contrast Given 10/30/19 0016)    ED Course  I have reviewed the triage vital signs and the nursing notes.  Pertinent labs & imaging results that were available during my care of the patient were reviewed by me and considered in my medical decision making (see chart for details).  Clinical Course as of Oct 30 142  Fri Oct 30, 2019  [id number] Received call from radiology.  Patient has perforated diverticulitis with pneumoperitoneum.  No evidence of abscess.  Images have been reviewed by me.  Zosyn and COVID-19 swab have been ordered.  Consulted general surgery and Dr. Johney Maine will come and evaluate the patient.   [MM]    Clinical Course User Index [MM] Prabhjot Piscitello, Laymond Purser, PA-C   MDM Rules/Calculators/A&P                      44 year old female with a history of uterine leiomyoma presenting from urgent care with lower abdominal pain, onset tonight.  No other associated symptoms.  Mildly tachycardic on arrival to the ER.  She is afebrile and vitals are otherwise unremarkable.  She is nontoxic-appearing.   Labs are notable for leukocytosis of 19.  No electrolyte derangements.  Urinalysis is unremarkable.  Pregnancy test is negative.  Given her exam, will order CT abdomen pelvis to further assess for appendicitis since she has a positive Rovsing sign and is tender over McBurney's point.  She declines pain medication at this time, but fentanyl and Zofran as needed have been ordered.  Discussed with general surgery who is evaluated the patient in the ER.  Dr. Johney Maine with general surgery will admit the patient.  On reevaluation, she is mildly tachycardic, normotensive.  No evidence of shock.  She continues to decline pain medication and is nontoxic.  The patient appears reasonably stabilized for admission considering the current resources, flow, and capabilities available in the ED at this time, and I doubt any other [facility] requiring further screening  and/or treatment in the ED prior to admission.   Final Clinical Impression(s) / ED Diagnoses Final diagnoses:  Perforated diverticulum of large intestine    Rx / DC Orders ED Discharge Orders    None       Joanne Gavel, PA-C [id number]    Delora Fuel, [place] [id number] [phone]

## 2019-10-29 NOTE — ED Triage Notes (Signed)
Pt c/o severe lower abdominal pain and pelvic pain approx 1930 tonight. PA suggested CT or [place] and pt came here to be evaluated.   Pt sts urgent care checked UA and gave a shot of Toradol.

## 2019-10-29 NOTE — ED Triage Notes (Signed)
Pt c/o sudden onset lower abdominal pain x 2 hours

## 2019-10-29 NOTE — Discharge Instructions (Addendum)
Please report to the emergency room now for further work up of your severe abdominal pain.  Pregnancy test is negative and I do not see signs of urinary tract infection.  I believe he would benefit more from having imaging such as a CT scan or ultrasound.

## 2019-10-29 NOTE — ED Provider Notes (Signed)
[facility]   MRN: [id number] DOB: 03/16/1976  Subjective:   Monica Hendrix is a 44 y.o. female presenting for acute onset of severe worsening lower abdominal and pelvic pain.  Patient states that her pains are worse than labor pain.  Has a history of allergies to hydrocodone.  Patient states that she has a history of uterine fibroids but this pain has been much worse than her pain when she has fibroid pain.  No current facility-administered medications for this encounter.  Current Outpatient Medications:  .  b complex vitamins tablet, Take 1 tablet by mouth daily., Disp: , Rfl:  .  cholecalciferol (VITAMIN D3) 25 MCG (1000 UT) tablet, Take 1,000 Units by mouth 2 (two) times daily., Disp: , Rfl:  .  Multiple Vitamins-Minerals (MULTIVITAMIN WITH MINERALS) tablet, Take 1 tablet by mouth daily., Disp: , Rfl:  .  vitamin E 1000 UNIT capsule, Take 1,000 Units by mouth daily., Disp: , Rfl:    Allergies  Allergen Reactions  . Macrobid [Nitrofurantoin Macrocrystal] Hives  . Vicodin [Hydrocodone-Acetaminophen] Itching    Past Medical History:  Diagnosis Date  . Fibroids   . No pertinent past medical history      Past Surgical History:  Procedure Laterality Date  . CESAREAN SECTION    . CESAREAN SECTION  04/14/2011   Procedure: CESAREAN SECTION;  Surgeon: Frederico Hamman, MD;  Location: [facility];  Service: Gynecology;  Laterality: N/A;  Repeat of baby girl at 63 APGAR 2/7    Family History  Problem Relation Age of Onset  . Diabetes Mother   . Hypertension Mother   . Cancer Father   . Diabetes Father     Social History   Tobacco Use  . Smoking status: Never Smoker  . Smokeless tobacco: Never Used  Substance Use Topics  . Alcohol use: Yes    Alcohol/week: 1.0 standard drinks    Types: 1 Glasses of wine per week    Comment: SOCIALLY  . Drug use: No    ROS   Objective:   Vitals: BP (!) 149/90   Pulse (!) 108   Temp 98.7 F (37.1 C)   Resp 16   LMP  10/04/2019 (Approximate)   SpO2 100%   Physical Exam Constitutional:      General: She is not in acute distress.    Appearance: Normal appearance. She is well-developed and normal weight. She is not ill-appearing, toxic-appearing or diaphoretic.  HENT:     Head: Normocephalic and atraumatic.     Right Ear: External ear normal.     Left Ear: External ear normal.     Nose: Nose normal.     Mouth/Throat:     Mouth: Mucous membranes are moist.     Pharynx: Oropharynx is clear.  Eyes:     General: No scleral icterus.    Extraocular Movements: Extraocular movements intact.     Pupils: Pupils are equal, round, and reactive to light.  Cardiovascular:     Rate and Rhythm: Normal rate and regular rhythm.     Heart sounds: Normal heart sounds. No murmur. No friction rub. No gallop.   Pulmonary:     Effort: Pulmonary effort is normal. No respiratory distress.     Breath sounds: Normal breath sounds. No stridor. No wheezing, rhonchi or rales.  Abdominal:     General: Bowel sounds are normal. There is no distension.     Palpations: Abdomen is soft. There is no mass.     Tenderness: There  is abdominal tenderness in the periumbilical area and suprapubic area. There is guarding and rebound. There is no right CVA tenderness or left CVA tenderness.  Skin:    General: Skin is warm and dry.     Coloration: Skin is not pale.     Findings: No rash.  Neurological:     General: No focal deficit present.     Mental Status: She is alert and oriented to person, place, and time.  Psychiatric:        Mood and Affect: Mood normal.        Behavior: Behavior normal.        Thought Content: Thought content normal.        Judgment: Judgment normal.     Results for orders placed or performed during the hospital encounter of 10/29/19 (from the past 24 hour(s))  POCT urinalysis dip (device)     Status: Abnormal   Collection Time: 10/29/19  8:23 PM  Result Value Ref Range   Glucose, UA NEGATIVE NEGATIVE  mg/dL   Bilirubin Urine NEGATIVE NEGATIVE   Ketones, ur TRACE (A) NEGATIVE mg/dL   Specific Gravity, Urine 1.025 1.005 - 1.030   Hgb urine dipstick NEGATIVE NEGATIVE   pH 6.5 5.0 - 8.0   Protein, ur NEGATIVE NEGATIVE mg/dL   Urobilinogen, UA 0.2 0.0 - 1.0 mg/dL   Nitrite NEGATIVE NEGATIVE   Leukocytes,Ua NEGATIVE NEGATIVE  POC urine pregnancy     Status: None   Collection Time: 10/29/19  8:28 PM  Result Value Ref Range   Preg Test, Ur NEGATIVE NEGATIVE  Pregnancy, urine POC     Status: None   Collection Time: 10/29/19  8:28 PM  Result Value Ref Range   Preg Test, Ur NEGATIVE NEGATIVE    Assessment and Plan :   PDMP not reviewed this encounter.  1. Lower abdominal pain   2. Uterine leiomyoma, unspecified location     Given patient's severe abdominal pain rated 15/10, recommended that she report to the emergency room for imaging.  She was given IM Toradol in clinic. Counseled patient on potential for adverse effects with medications prescribed/recommended today, ER and return-to-clinic precautions discussed, patient verbalized understanding.    Jaynee Eagles, [place] 10/29/19 2035

## 2019-10-30 ENCOUNTER — Emergency Department (HOSPITAL_COMMUNITY): Payer: No Typology Code available for payment source

## 2019-10-30 ENCOUNTER — Encounter (HOSPITAL_COMMUNITY): Payer: Self-pay | Admitting: Surgery

## 2019-10-30 DIAGNOSIS — Z800 Family history of malignant neoplasm of digestive organs: Secondary | ICD-10-CM

## 2019-10-30 DIAGNOSIS — D259 Leiomyoma of uterus, unspecified: Secondary | ICD-10-CM | POA: Diagnosis present

## 2019-10-30 DIAGNOSIS — Z8249 Family history of ischemic heart disease and other diseases of the circulatory system: Secondary | ICD-10-CM | POA: Diagnosis not present

## 2019-10-30 DIAGNOSIS — Z20822 Contact with and (suspected) exposure to covid-19: Secondary | ICD-10-CM | POA: Diagnosis present

## 2019-10-30 DIAGNOSIS — Z881 Allergy status to other antibiotic agents status: Secondary | ICD-10-CM | POA: Diagnosis not present

## 2019-10-30 DIAGNOSIS — Z833 Family history of diabetes mellitus: Secondary | ICD-10-CM | POA: Diagnosis not present

## 2019-10-30 DIAGNOSIS — Z885 Allergy status to narcotic agent status: Secondary | ICD-10-CM | POA: Diagnosis not present

## 2019-10-30 DIAGNOSIS — K5720 Diverticulitis of large intestine with perforation and abscess without bleeding: Secondary | ICD-10-CM

## 2019-10-30 LAB — CBC
HCT: 36.2 % (ref 36.0–46.0)
Hemoglobin: 12.6 g/dL (ref 12.0–15.0)
MCH: 34.1 pg — ABNORMAL HIGH (ref 26.0–34.0)
MCHC: 34.8 g/dL (ref 30.0–36.0)
MCV: 97.8 fL (ref 80.0–100.0)
Platelets: 304 10*3/uL (ref 150–400)
RBC: 3.7 MIL/uL — ABNORMAL LOW (ref 3.87–5.11)
RDW: 11.9 % (ref 11.5–15.5)
WBC: 23.7 10*3/uL — ABNORMAL HIGH (ref 4.0–10.5)
nRBC: 0 % (ref 0.0–0.2)

## 2019-10-30 LAB — BASIC METABOLIC PANEL
Anion gap: 8 (ref 5–15)
BUN: 8 mg/dL (ref 6–20)
CO2: 22 mmol/L (ref 22–32)
Calcium: 8.3 mg/dL — ABNORMAL LOW (ref 8.9–10.3)
Chloride: 106 mmol/L (ref 98–111)
Creatinine, Ser: 0.68 mg/dL (ref 0.44–1.00)
GFR calc Af Amer: >60 mL/min (ref >60)
GFR calc non Af Amer: >60 mL/min (ref >60)
Glucose, Bld: 135 mg/dL — ABNORMAL HIGH (ref 70–99)
Potassium: 3.9 mmol/L (ref 3.5–5.1)
Sodium: 136 mmol/L (ref 135–145)

## 2019-10-30 LAB — RESPIRATORY PANEL BY RT PCR (FLU A&B, COVID)
Influenza A by PCR: NEGATIVE
Influenza B by PCR: NEGATIVE
SARS Coronavirus 2 by RT PCR: NEGATIVE

## 2019-10-30 LAB — URINE CULTURE: Culture: <10000 — AB

## 2019-10-30 LAB — HIV ANTIBODY (ROUTINE TESTING W REFLEX): HIV Screen 4th Generation wRfx: NONREACTIVE

## 2019-10-30 MED ORDER — LACTATED RINGERS IV SOLN: INTRAVENOUS | Status: DC

## 2019-10-30 MED ORDER — DIPHENHYDRAMINE HCL 50 MG/ML IJ SOLN: 12.5000 mg | Freq: Four times a day (QID) | INTRAMUSCULAR | Status: DC | PRN

## 2019-10-30 MED ORDER — METHOCARBAMOL 1000 MG/10ML IJ SOLN
1000.0000 mg | Freq: Four times a day (QID) | INTRAVENOUS | Status: DC | PRN
  Filled 2019-10-30: qty 10

## 2019-10-30 MED ORDER — METOPROLOL TARTRATE 5 MG/5ML IV SOLN: 5.0000 mg | Freq: Four times a day (QID) | INTRAVENOUS | Status: DC | PRN

## 2019-10-30 MED ORDER — PIPERACILLIN-TAZOBACTAM 3.375 G IVPB 30 MIN
3.3750 g | Freq: Once | INTRAVENOUS | Status: AC
  Administered 2019-10-30: 01:00:00 3.375 g via INTRAVENOUS
  Filled 2019-10-30: qty 50

## 2019-10-30 MED ORDER — SIMETHICONE 80 MG PO CHEW
40.0000 mg | CHEWABLE_TABLET | Freq: Four times a day (QID) | ORAL | Status: DC | PRN
  Administered 2019-10-31 – 2019-11-02 (×4): 40 mg via ORAL
  Filled 2019-10-30 (×5): qty 1

## 2019-10-30 MED ORDER — ONDANSETRON 4 MG PO TBDP: 4.0000 mg | ORAL_TABLET | Freq: Four times a day (QID) | ORAL | Status: DC | PRN

## 2019-10-30 MED ORDER — IOHEXOL 300 MG/ML  SOLN
100.0000 mL | Freq: Once | INTRAMUSCULAR | Status: AC | PRN
  Administered 2019-10-30: 100 mL via INTRAVENOUS

## 2019-10-30 MED ORDER — MAGIC MOUTHWASH
15.0000 mL | Freq: Four times a day (QID) | ORAL | Status: DC | PRN
  Filled 2019-10-30: qty 15

## 2019-10-30 MED ORDER — ACETAMINOPHEN 325 MG PO TABS
650.0000 mg | ORAL_TABLET | Freq: Four times a day (QID) | ORAL | Status: DC | PRN
  Administered 2019-10-30 – 2019-11-01 (×5): 650 mg via ORAL
  Filled 2019-10-30 (×5): qty 2

## 2019-10-30 MED ORDER — SODIUM CHLORIDE 0.9 % IV SOLN: Freq: Three times a day (TID) | INTRAVENOUS | Status: DC | PRN

## 2019-10-30 MED ORDER — BISACODYL 10 MG RE SUPP: 10.0000 mg | Freq: Every day | RECTAL | Status: DC | PRN

## 2019-10-30 MED ORDER — ONDANSETRON HCL 4 MG/2ML IJ SOLN: 4.0000 mg | Freq: Four times a day (QID) | INTRAMUSCULAR | Status: DC | PRN

## 2019-10-30 MED ORDER — PIPERACILLIN-TAZOBACTAM 3.375 G IVPB
3.3750 g | Freq: Three times a day (TID) | INTRAVENOUS | Status: DC
  Administered 2019-10-30 – 2019-11-03 (×13): 3.375 g via INTRAVENOUS
  Filled 2019-10-30 (×17): qty 50

## 2019-10-30 MED ORDER — HYDROMORPHONE HCL 1 MG/ML IJ SOLN: 0.5000 mg | INTRAMUSCULAR | Status: DC | PRN

## 2019-10-30 MED ORDER — LACTATED RINGERS IV BOLUS
1000.0000 mL | Freq: Once | INTRAVENOUS | Status: AC
  Administered 2019-10-30: 04:00:00 1000 mL via INTRAVENOUS

## 2019-10-30 MED ORDER — PROCHLORPERAZINE MALEATE 10 MG PO TABS: 10.0000 mg | ORAL_TABLET | Freq: Four times a day (QID) | ORAL | Status: DC | PRN

## 2019-10-30 MED ORDER — ACETAMINOPHEN 650 MG RE SUPP: 650.0000 mg | Freq: Four times a day (QID) | RECTAL | Status: DC | PRN

## 2019-10-30 MED ORDER — DIPHENHYDRAMINE HCL 12.5 MG/5ML PO ELIX: 12.5000 mg | ORAL_SOLUTION | Freq: Four times a day (QID) | ORAL | Status: DC | PRN

## 2019-10-30 MED ORDER — PROCHLORPERAZINE EDISYLATE 10 MG/2ML IJ SOLN: 5.0000 mg | Freq: Four times a day (QID) | INTRAMUSCULAR | Status: DC | PRN

## 2019-10-30 MED ORDER — LACTATED RINGERS IV BOLUS: 1000.0000 mL | Freq: Three times a day (TID) | INTRAVENOUS | Status: AC | PRN

## 2019-10-30 MED ORDER — LIP MEDEX EX OINT
1.0000 "application " | TOPICAL_OINTMENT | Freq: Two times a day (BID) | CUTANEOUS | Status: DC
  Administered 2019-10-31 – 2019-11-03 (×4): 1 via TOPICAL
  Filled 2019-10-30 (×3): qty 7

## 2019-10-30 MED ORDER — ENOXAPARIN SODIUM 40 MG/0.4ML ~~LOC~~ SOLN
40.0000 mg | Freq: Every day | SUBCUTANEOUS | Status: DC
  Administered 2019-10-30 – 2019-11-05 (×7): 40 mg via SUBCUTANEOUS
  Filled 2019-10-30 (×7): qty 0.4

## 2019-10-30 MED ORDER — SODIUM CHLORIDE (PF) 0.9 % IJ SOLN
INTRAMUSCULAR | Status: AC
  Filled 2019-10-30: qty 50

## 2019-10-30 NOTE — Plan of Care (Signed)
POC initiated 

## 2019-10-30 NOTE — Progress Notes (Signed)
[facility] Progress Note     Subjective: Patient reports she is tired. Pain slightly improved from presentation but feels like gas pains. Most painful in LLQ. Denies nausea. Passing some flatus. Discussed operative vs non-op management of diverticulitis and answered questions regarding this.   Review of Systems  Constitutional: Negative for chills and fever.  Respiratory: Negative for shortness of breath.   Cardiovascular: Negative for chest pain.  Gastrointestinal: Positive for abdominal pain. Negative for nausea and vomiting.  Genitourinary: Negative for dysuria, frequency and urgency.     Objective: Vital signs in last 24 hours: Temp:  [98.5 F (36.9 C)-99.9 F (37.7 C)] 99.9 F (37.7 C) (04/16 0652) Pulse Rate:  [102-126] 126 (04/16 0652) Resp:  [15-20] 20 (04/16 0652) BP: (123-149)/(72-91) 124/73 (04/16 0652) SpO2:  [97 %-100 %] 97 % (04/16 [id number]) Weight:  [72.6 kg] 72.6 kg (04/15 2128) Last BM Date: 10/29/19  Intake/Output from previous day: 04/15 0701 - 04/16 0700 In: 1100 [IV Piggyback:1100] Out: -  Intake/Output this shift: No intake/output data recorded.  PE: General: pleasant, WD, WN female who is laying in bed in NAD HEENT:  Sclera are noninjected.  PERRL.  Ears and nose without any masses or lesions.  Mouth is pink and moist Heart: sinus tachycardia.  Normal s1,s2. No obvious murmurs, gallops, or rubs noted.  Palpable radial and pedal pulses bilaterally Lungs: CTAB, no wheezes, rhonchi, or rales noted.  Respiratory effort nonlabored Abd: soft, ttp in LLQ, no guarding or peritonitis on my exam, moderately distended, +BS, no masses, hernias, or organomegaly MS: all 4 extremities are symmetrical with no cyanosis, clubbing, or edema. Skin: warm and dry with no masses, lesions, or rashes Neuro: Cranial nerves 2-12 grossly intact, sensation grossly intact throughout Psych: A&Ox3 with an appropriate affect.   Lab Results:  Recent Labs     10/29/19 2136 10/30/19 0724  WBC 19.2* 23.7*  HGB 13.8 12.6  HCT 39.6 36.2  PLT 312 304   BMET Recent Labs    10/29/19 2136 10/30/19 0724  NA 138 136  K 4.6 3.9  CL 105 106  CO2 25 22  GLUCOSE 133* 135*  BUN 13 8  CREATININE 0.78 0.68  CALCIUM 8.7* 8.3*   PT/INR No results for input(s): LABPROT, INR in the last 72 hours. CMP     Component Value Date/Time   NA 136 10/30/2019 0724   NA 139 10/24/2017 0905   K 3.9 10/30/2019 0724   CL 106 10/30/2019 0724   CO2 22 10/30/2019 0724   GLUCOSE 135 (H) 10/30/2019 0724   BUN 8 10/30/2019 0724   BUN 10 10/24/2017 0905   CREATININE 0.68 10/30/2019 0724   CALCIUM 8.3 (L) 10/30/2019 0724   PROT 7.5 10/29/2019 2136   PROT 7.4 10/24/2017 0905   ALBUMIN 4.2 10/29/2019 2136   ALBUMIN 4.4 10/24/2017 0905   AST 20 10/29/2019 2136   ALT 17 10/29/2019 2136   ALKPHOS 63 10/29/2019 2136   BILITOT 0.9 10/29/2019 2136   BILITOT 0.6 10/24/2017 0905   GFRNONAA >60 10/30/2019 0724   GFRAA >60 10/30/2019 0724   Lipase     Component Value Date/Time   LIPASE 25 10/29/2019 2136       Studies/Results: CT ABDOMEN PELVIS W CONTRAST  Result Date: 10/30/2019 CLINICAL DATA:  Sudden onset lower abdominal pain for 2 hours EXAM: CT ABDOMEN AND PELVIS WITH CONTRAST TECHNIQUE: Multidetector CT imaging of the abdomen and pelvis was performed using the standard protocol following bolus administration of  intravenous contrast. CONTRAST:  184mL OMNIPAQUE IOHEXOL 300 MG/ML  SOLN COMPARISON:  None. FINDINGS: Lower chest: No acute pleural or parenchymal lung disease. Hepatobiliary: No focal liver abnormality is seen. No gallstones, gallbladder wall thickening, or biliary dilatation. Pancreas: Unremarkable. No pancreatic ductal dilatation or surrounding inflammatory changes. Spleen: Normal in size without focal abnormality. Adrenals/Urinary Tract: Adrenal glands are unremarkable. Kidneys are normal, without renal calculi, focal lesion, or hydronephrosis.  Bladder is unremarkable. Stomach/Bowel: There is abnormal wall thickening of the mid sigmoid colon, with surrounding pericolonic fat stranding. Findings are consistent with acute diverticulitis. There is free gas within the peritoneal cavity consistent with perforated diverticulitis. No fluid collection or abscess. No bowel obstruction or ileus. Normal appendix right lower quadrant. Vascular/Lymphatic: No significant vascular findings are present. No enlarged abdominal or pelvic lymph nodes. Reproductive: Uterus is enlarged, with multiple uterine fibroids. No adnexal masses. Other: There is a small amount of free fluid within the cul-de-sac. No fluid collection or abscess. Pneumoperitoneum is seen throughout the abdomen. Musculoskeletal: No acute or destructive bony lesions. Reconstructed images demonstrate no additional findings. IMPRESSION: 1. Acute sigmoid diverticulitis. There is evidence of perforation, with pneumoperitoneum throughout the abdomen and pelvis. No fluid collection or abscess. 2. Trace pelvic free fluid. 3. Fibroid uterus. These results were called by telephone at the time of interpretation on 10/30/2019 at 12:38 am to provider MIA [facility] , who verbally acknowledged these results. Electronically Signed   By: Randa Ngo M.D.   On: 10/30/2019 00:39    Anti-infectives: Anti-infectives (From admission, onward)   Start     Dose/Rate Route Frequency Ordered Stop   10/30/19 0730  piperacillin-tazobactam (ZOSYN) IVPB 3.375 g     3.375 g 12.5 mL/hr over 240 Minutes Intravenous Every 8 hours 10/30/19 0145     10/30/19 0045  piperacillin-tazobactam (ZOSYN) IVPB 3.375 g     3.375 g 100 mL/hr over 30 Minutes Intravenous  Once 10/30/19 0041 10/30/19 0154       Assessment/Plan Uterine fibroids  Acute sigmoid diverticulitis with pneumoperitoneum - WBC 23 from 19, afebrile, some tachycardia, BP stable - patient clinically feels slightly improved - will continue to monitor closely - NPO,  IV abx, IVF - no peritonitis on exam - no indication for emergent surgical intervention - if patient clinically worsens will need to consider repeat imaging vs operative exploration with likely Hartmann's depending on hemodynamics - repeat labs in AM  FEN: NPO, IVF VTE: SCDs, lovenox ID: [email or web address] 4/16>>  LOS: 0 days    Norm Parcel , [facility] 10/30/2019, 10:05 AM Please see Amion for pager number during day hours 7:00am-4:30pm

## 2019-10-30 NOTE — ED Notes (Signed)
Pt continues to say she would like to hold off on pain med.

## 2019-10-30 NOTE — H&P (Addendum)
Monica Hendrix  1976/06/17 [id number]  CARE TEAM:  PCP: Forrest Moron, MD  Outpatient Care Team: Patient Care Team: Forrest Moron, MD as PCP - General (Internal Medicine) Crawford Givens, MD as Consulting Physician (Obstetrics and Gynecology)  Inpatient Treatment Team: Treatment Team: Attending Provider: Delora Fuel, MD; Registered Nurse: Stefanie Libel, RN; Physician Assistant: Joanne Gavel, PA-C; Technician: Luella Cook, EMT; Technician: Gilford Silvius, NT; Consulting Physician: Edison Pace, Md, MD   This patient is a 44 y.o.female who presents today for surgical evaluation at the request of Joline Maxcy, PA, [facility].   Chief complaint / Reason for evaluation: Abdominal pain was free air.  Probable diverticulitis  Pleasant woman who noted sudden severe abdominal pain.  Worsened.  Went to urgent care.  Exam concerning.  Recommendation made for CAT scan and transfer to [facility] main emergency department.  CT scan showing evidence of free air with probable sigmoid diverticulitis is the etiology.  Surgical consultation requested.  Patient's father had colon cancer.  She had a colonoscopy at age 51 that she recalls being underwhelming.  This was done by Dr. Cannon Kettle at [facility] gastroenterology.  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  She had C-sections and has history of uterine fibroid but no other abdominal issues.  She normally moves her bowels every day.  Can walk several miles without difficulty.  She does not know if she ever smoked tobacco.  She is not diabetic.  She occasionally has wine but no binge drinking.  No recent alcohol intake.  No personal nor family history of GI/colon cancer, inflammatory bowel disease, irritable bowel syndrome, allergy such as Celiac Sprue, dietary/dairy problems, colitis, ulcers nor gastritis.  No recent sick contacts/gastroenteritis.  No travel outside the country.  No changes in  diet.  No dysphagia to solids or liquids.  No significant heartburn or reflux.  No hematochezia, hematemesis, coffee ground emesis.  No evidence of prior gastric/peptic ulceration.    Assessment  Monica Hendrix  44 y.o. female       Problem List:  Principal Problem:   Perforation of sigmoid colon due to diverticulitis Active Problems:   Uterine leiomyoma   Family history of colon cancer in father   History physical and CT scan finding concerning for perforated diverticulitis with pockets of pneumoperitoneum  Plan:  Admission  IV antibiotics.  Bowel rest.  Sips and ice chips.  NG tube if worse.  Nausea & pain control.  Serial exams.  At this point while she does have moderate volume of pneumoperitoneum, she is not toxic nor in shock right now.  I am pretty hopeful that we will be able to cool this down with antibiotics and bowel rest first.  If she is rapidly worsens, may require operative exploration with [facility] resection this admission.  Hopefully not too likely.  If not improved, may need repeat CAT scan in 4-5 days to rule out abscess.  Given the fact this is a complicated attack in a relatively young person, I suspect she would benefit from elective sigmoid colectomy in the future to prevent future attacks.  She would be a good candidate for robotic sigmoid colectomy.  However, that is not an emergent issue at this time.  That can be discussed in outpatient if she wishes.  Let see how this hospital course goes.  She already had a colonoscopy 3 years ago.  It would be helpful to get the  report of the endoscopy to make sure there were no surprises.  Maybe consider repeating it prior to any consideration of surgery.  Pathophysiology of diverticulitis and its differential diagnosis was discussed with the patient and her husband.  Questions answered.  They agree with the plan.  -VTE prophylaxis- SCDs, etc  -mobilize as tolerated to help recovery  45 minutes spent in review,  evaluation, examination, counseling, and coordination of care.  More than 50% of that time was spent in counseling.  Adin Hector, MD, FACS, MASCRS Gastrointestinal and Minimally Invasive Surgery  [facility] [address], [place], [place] [address] [phone] Main / Paging [phone] Fax     10/30/2019      Past Medical History:  Diagnosis Date  . Fibroids   . Infection of cesarean section or perineal wound 04/24/2011  . No pertinent past medical history     Past Surgical History:  Procedure Laterality Date  . CESAREAN SECTION    . CESAREAN SECTION  04/14/2011   Procedure: CESAREAN SECTION;  Surgeon: Frederico Hamman, MD;  Location: [facility];  Service: Gynecology;  Laterality: N/A;  Repeat of baby girl at 3 APGAR 2/7    Social History   Socioeconomic History  . Marital status: Married    Spouse name: Not on file  . Number of children: 2  . Years of education: Not on file  . Highest education level: Not on file  Occupational History  . Not on file  Tobacco Use  . Smoking status: Never Smoker  . Smokeless tobacco: Never Used  Substance and Sexual Activity  . Alcohol use: Yes    Alcohol/week: 1.0 standard drinks    Types: 1 Glasses of wine per week    Comment: SOCIALLY  . Drug use: No  . Sexual activity: Yes    Birth control/protection: None  Other Topics Concern  . Not on file  Social History Narrative  . Not on file   Social Determinants of Health   Financial Resource Strain:   . Difficulty of Paying Living Expenses:   Food Insecurity:   . Worried About Charity fundraiser in the Last Year:   . Arboriculturist in the Last Year:   Transportation Needs:   . Film/video editor (Medical):   Marland Kitchen Lack of Transportation (Non-Medical):   Physical Activity:   . Days of Exercise per Week:   . Minutes of Exercise per Session:   Stress:   . Feeling of Stress :   Social Connections:   . Frequency of Communication  with Friends and Family:   . Frequency of Social Gatherings with Friends and Family:   . Attends Religious Services:   . Active Member of Clubs or Organizations:   . Attends Archivist Meetings:   Marland Kitchen Marital Status:   Intimate Partner Violence:   . Fear of Current or Ex-Partner:   . Emotionally Abused:   Marland Kitchen Physically Abused:   . Sexually Abused:     Family History  Problem Relation Age of Onset  . Diabetes Mother   . Hypertension Mother   . Cancer Father   . Diabetes Father     Current Facility-Administered Medications  Medication Dose Route Frequency Provider Last Rate Last Admin  . fentaNYL (SUBLIMAZE) injection 75 mcg  75 mcg Intravenous Once McDonald, Mia A, PA-C   Stopped at 10/29/19 2336  . ondansetron (ZOFRAN) injection 4 mg  4 mg Intravenous Once PRN  McDonald, Mia A, PA-C      . piperacillin-tazobactam (ZOSYN) IVPB 3.375 g  3.375 g Intravenous Once McDonald, Mia A, PA-C      . sodium chloride (PF) 0.9 % injection           . sodium chloride flush (NS) 0.9 % injection 3 mL  3 mL Intravenous Once Maudie Flakes, MD       Current Outpatient Medications  Medication Sig Dispense Refill  . b complex vitamins tablet Take 1 tablet by mouth daily.    . cholecalciferol (VITAMIN D3) 25 MCG (1000 UT) tablet Take 1,000 Units by mouth 2 (two) times daily.    . Multiple Vitamins-Minerals (MULTIVITAMIN WITH MINERALS) tablet Take 1 tablet by mouth daily.       Allergies  Allergen Reactions  . Macrobid [Nitrofurantoin Macrocrystal] Hives  . Vicodin [Hydrocodone-Acetaminophen] Itching  . Hydrocodone-Acetaminophen Itching    ROS:   All other systems reviewed & are negative except per HPI or as noted below: Constitutional:  No fevers, chills, sweats.  Weight stable Eyes:  No vision changes, No discharge HENT:  No sore throats, nasal drainage Lymph: No neck swelling, No bruising easily Pulmonary:  No cough, productive sputum CV: No orthopnea, PND  Patient walks 60 minutes  for about 2 miles without difficulty.  No exertional chest/neck/shoulder/arm pain. GI:  No personal nor family history of GI cancer aside from her father who was diagnosed with colon cancer., inflammatory bowel disease, irritable bowel syndrome, allergy such as Celiac Sprue, dietary/dairy problems, colitis, ulcers nor gastritis.  No recent sick contacts/gastroenteritis.  No travel outside the country.  No changes in diet. Renal: No UTIs, No hematuria.  No history of kidney stones Genital:  No drainage, bleeding, masses.  No menorrhagia.  She has a stable fibroid but no issues with pain or endometriosis Musculoskeletal: No severe joint pain.  Good ROM major joints Skin:  No sores or lesions.  No rashes Heme/Lymph:  No easy bleeding.  No swollen lymph nodes Neuro: No focal weakness/numbness.  No seizures Psych: No suicidal ideation.  No hallucinations  BP (!) 141/85   Pulse (!) 102   Temp 98.5 F (36.9 C) (Oral)   Resp 16   Ht 5\' 2"  (1.575 m)   Wt 72.6 kg   LMP 10/04/2019 (Approximate) Comment: negative HCG 10/29/19  SpO2 99%   BMI 29.26 kg/m   Physical Exam: Constitutional: Not cachectic.  Tired but not toxic nor sickly.  Hygeine adequate.  Vitals signs as above.   Eyes: Pupils reactive, normal extraocular movements. Sclera nonicteric Neuro: CN II-XII intact.  No major focal sensory defects.  No major motor deficits. Lymph: No head/neck/groin lymphadenopathy Psych:  No severe agitation.  No severe anxiety.  Judgment & insight Adequate, Oriented x4 HENT: Normocephalic, Mucus membranes moist.  No thrush.   Neck: Supple, No tracheal deviation.  No obvious thyromegaly Chest: No pain to chest wall compression.  Good respiratory excursion.  No audible wheezing CV:  Pulses intact.  Regular rhythm.  No major extremity edema Abdomen: Soft, Nondistended.  Periumbilical and left lower quadrant tenderness to palpation.  Mild guarding.  Mild reproduction of pain with cough but no severe guarding.   No diffuse peritonitis. No incarcerated hernias.  No hepatomegaly.  No splenomegaly Gen:  No inguinal hernias.  No inguinal lymphadenopathy.  Pfannenstiel incision healed Ext: No obvious deformity or contracture no significant edema.  No cyanosis Skin: No major subcutaneous nodules.  Warm and dry Musculoskeletal: Severe joint rigidity  not present.  No obvious clubbing.  No digital petechiae.     Results:   Labs: Results for orders placed or performed during the hospital encounter of 10/29/19 (from the past 48 hour(s))  Lipase, blood     Status: None   Collection Time: 10/29/19  9:36 PM  Result Value Ref Range   Lipase 25 11 - 51 U/L    Comment: Performed at [facility], [place] [address], [place], [place] [address]  Comprehensive metabolic panel     Status: Abnormal   Collection Time: 10/29/19  9:36 PM  Result Value Ref Range   Sodium 138 135 - 145 mmol/L   Potassium 4.6 3.5 - 5.1 mmol/L   Chloride 105 98 - 111 mmol/L   CO2 25 22 - 32 mmol/L   Glucose, Bld 133 (H) 70 - 99 mg/dL    Comment: Glucose reference range applies only to samples taken after fasting for at least 8 hours.   BUN 13 6 - 20 mg/dL   Creatinine, Ser 0.78 0.44 - 1.00 mg/dL   Calcium 8.7 (L) 8.9 - 10.3 mg/dL   Total Protein 7.5 6.5 - 8.1 g/dL   Albumin 4.2 3.5 - 5.0 g/dL   AST 20 15 - 41 U/L   ALT 17 0 - 44 U/L   Alkaline Phosphatase 63 38 - 126 U/L   Total Bilirubin 0.9 0.3 - 1.2 mg/dL   GFR calc non Af Amer >60 >60 mL/min   GFR calc Af Amer >60 >60 mL/min   Anion gap 8 5 - 15    Comment: Performed at [facility], [place] [address], [place], [place] [address]  CBC     Status: Abnormal   Collection Time: 10/29/19  9:36 PM  Result Value Ref Range   WBC 19.2 (H) 4.0 - 10.5 K/uL   RBC 4.04 3.87 - 5.11 MIL/uL   Hemoglobin 13.8 12.0 - 15.0 g/dL   HCT 39.6 36.0 - 46.0 %   MCV 98.0 80.0 - 100.0 fL   MCH 34.2 (H) 26.0 - 34.0 pg   MCHC 34.8 30.0 - 36.0 g/dL   RDW 11.8 11.5 -  15.5 %   Platelets 312 150 - 400 K/uL   nRBC 0.0 0.0 - 0.2 %    Comment: Performed at [facility], [place] [address], [place], [place] [address]  Urinalysis, Routine w reflex microscopic     Status: Abnormal   Collection Time: 10/29/19  9:36 PM  Result Value Ref Range   Color, Urine YELLOW YELLOW   APPearance CLEAR CLEAR   Specific Gravity, Urine 1.025 1.005 - 1.030   pH 6.0 5.0 - 8.0   Glucose, UA NEGATIVE NEGATIVE mg/dL   Hgb urine dipstick NEGATIVE NEGATIVE   Bilirubin Urine NEGATIVE NEGATIVE   Ketones, ur 5 (A) NEGATIVE mg/dL   Protein, ur NEGATIVE NEGATIVE mg/dL   Nitrite NEGATIVE NEGATIVE   Leukocytes,Ua NEGATIVE NEGATIVE    Comment: Performed at [place] [address], [place], [place] [address]  I-Stat beta hCG blood, ED     Status: None   Collection Time: 10/29/19  9:41 PM  Result Value Ref Range   I-stat hCG, quantitative <5.0 <5 mIU/mL   Comment 3            Comment:   GEST. AGE      CONC.  (mIU/mL)   <=1 WEEK        5 - 50  2 WEEKS       50 - 500     3 WEEKS       100 - 10,000     4 WEEKS     1,000 - 30,000        FEMALE AND NON-PREGNANT FEMALE:     LESS THAN 5 mIU/mL     Imaging / Studies: CT ABDOMEN PELVIS W CONTRAST  Result Date: 10/30/2019 CLINICAL DATA:  Sudden onset lower abdominal pain for 2 hours EXAM: CT ABDOMEN AND PELVIS WITH CONTRAST TECHNIQUE: Multidetector CT imaging of the abdomen and pelvis was performed using the standard protocol following bolus administration of intravenous contrast. CONTRAST:  152mL OMNIPAQUE IOHEXOL 300 MG/ML  SOLN COMPARISON:  None. FINDINGS: Lower chest: No acute pleural or parenchymal lung disease. Hepatobiliary: No focal liver abnormality is seen. No gallstones, gallbladder wall thickening, or biliary dilatation. Pancreas: Unremarkable. No pancreatic ductal dilatation or surrounding inflammatory changes. Spleen: Normal in size without focal abnormality. Adrenals/Urinary Tract:  Adrenal glands are unremarkable. Kidneys are normal, without renal calculi, focal lesion, or hydronephrosis. Bladder is unremarkable. Stomach/Bowel: There is abnormal wall thickening of the mid sigmoid colon, with surrounding pericolonic fat stranding. Findings are consistent with acute diverticulitis. There is free gas within the peritoneal cavity consistent with perforated diverticulitis. No fluid collection or abscess. No bowel obstruction or ileus. Normal appendix right lower quadrant. Vascular/Lymphatic: No significant vascular findings are present. No enlarged abdominal or pelvic lymph nodes. Reproductive: Uterus is enlarged, with multiple uterine fibroids. No adnexal masses. Other: There is a small amount of free fluid within the cul-de-sac. No fluid collection or abscess. Pneumoperitoneum is seen throughout the abdomen. Musculoskeletal: No acute or destructive bony lesions. Reconstructed images demonstrate no additional findings. IMPRESSION: 1. Acute sigmoid diverticulitis. There is evidence of perforation, with pneumoperitoneum throughout the abdomen and pelvis. No fluid collection or abscess. 2. Trace pelvic free fluid. 3. Fibroid uterus. These results were called by telephone at the time of interpretation on 10/30/2019 at 12:38 am to provider [facility] , who verbally acknowledged these results. Electronically Signed   By: Randa Ngo M.D.   On: 10/30/2019 00:39    Medications / Allergies: per chart  Antibiotics: Anti-infectives (From admission, onward)   Start     Dose/Rate Route Frequency Ordered Stop   10/30/19 0045  piperacillin-tazobactam (ZOSYN) IVPB 3.375 g     3.375 g 100 mL/hr over 30 Minutes Intravenous  Once 10/30/19 0041          Note: Portions of this report may have been transcribed using voice recognition software. Every effort was made to ensure accuracy; however, inadvertent computerized transcription errors may be present.   Any transcriptional errors that result  from this process are unintentional.    Adin Hector, MD, FACS, MASCRS Gastrointestinal and Minimally Invasive Surgery  [facility] [address], [place], [place] [address] [phone] Main / Paging [phone] Fax     10/30/2019

## 2019-10-30 NOTE — Progress Notes (Signed)
Lab Results WBC  Date/Time Value Ref Range Status  10/29/2019 09:36 PM 19.2 (H) 4.0 - 10.5 K/uL Final  01/01/2019 03:16 PM 11.5 (H) 3.4 - 10.8 x10E3/uL Final  10/24/2017 09:05 AM 8.5 3.4 - 10.8 x10E3/uL Final  04/25/2011 12:45 PM 13.4 (H) 4.0 - 10.5 K/uL Final  04/23/2011 06:05 PM 22.9 (H) 4.0 - 10.5 K/uL Final   Neutrophils Relative %  Date/Time Value Ref Range Status  04/25/2011 12:45 PM 75 43 - 77 % Final  04/23/2011 06:05 PM 83 (H) 43 - 77 % Final  04/14/2011 11:02 AM 75 43 - 77 % Final   No results found for: PCO2ART No results found for: LATICACIDVEN No results found for: PCO2VEN   Pt arrived in [place], protocol started , LR bolus intitiated once fluids available on unit. Pt declines pain meds despite rating pain 7/7 and being tachycardic.

## 2019-10-30 NOTE — Consult Note (Deleted)
Monica Hendrix  Mar 25, 1976 [id number]  CARE TEAM:  PCP: Forrest Moron, MD  Outpatient Care Team: Patient Care Team: Forrest Moron, MD as PCP - General (Internal Medicine) Crawford Givens, MD as Consulting Physician (Obstetrics and Gynecology)  Inpatient Treatment Team: Treatment Team: Attending Provider: Delora Fuel, MD; Registered Nurse: Stefanie Libel, RN; Physician Assistant: Joanne Gavel, PA-C; Technician: Luella Cook, EMT; Technician: Gilford Silvius, NT; Consulting Physician: Edison Pace, Md, MD   This patient is a 44 y.o.female who presents today for surgical evaluation at the request of Joline Maxcy, PA, [facility].   Chief complaint / Reason for evaluation: Abdominal pain was free air.  Probable diverticulitis  Pleasant woman who noted sudden severe abdominal pain.  Worsened.  Went to urgent care.  Exam concerning.  Recommendation made for CAT scan and transfer to [facility] main emergency department.  CT scan showing evidence of free air with probable sigmoid diverticulitis is the etiology.  Surgical consultation requested.  Patient's father had colon cancer.  She had a colonoscopy at age 72 that she recalls being underwhelming.  This was done by Dr. Cannon Kettle at [facility] gastroenterology.  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  She had C-sections and has history of uterine fibroid but no other abdominal issues.  She normally moves her bowels every day.  Can walk several miles without difficulty.  She does not know if she ever smoked tobacco.  She is not diabetic.  She occasionally has wine but no binge drinking.  No recent alcohol intake.  No personal nor family history of GI/colon cancer, inflammatory bowel disease, irritable bowel syndrome, allergy such as Celiac Sprue, dietary/dairy problems, colitis, ulcers nor gastritis.  No recent sick contacts/gastroenteritis.  No travel outside the country.  No changes in  diet.  No dysphagia to solids or liquids.  No significant heartburn or reflux.  No hematochezia, hematemesis, coffee ground emesis.  No evidence of prior gastric/peptic ulceration.    Assessment  Monica Hendrix  44 y.o. female       Problem List:  Principal Problem:   Perforation of sigmoid colon due to diverticulitis Active Problems:   Uterine leiomyoma   Family history of colon cancer in father   History physical and CT scan finding concerning for perforated diverticulitis with pockets of pneumoperitoneum  Plan:  Admission  IV antibiotics.  Bowel rest.  Sips and ice chips.  NG tube if worse.  Nausea & pain control.  Serial exams.  At this point while she does have moderate volume of pneumoperitoneum, she is not toxic nor in shock right now.  I am pretty hopeful that we will be able to cool this down with antibiotics and bowel rest first.  If she is rapidly worsens, may require operative exploration with [facility] resection this admission.  Hopefully not too likely.  If not improved, may need repeat CAT scan in 4-5 days to rule out abscess.  Given the fact this is a complicated attack in a relatively young person, I suspect she would benefit from elective sigmoid colectomy in the future to prevent future attacks.  She would be a good candidate for robotic sigmoid colectomy.  However, that is not an emergent issue at this time.  That can be discussed in outpatient if she wishes.  Let see how this hospital course goes.  She already had a colonoscopy 3 years ago.  It would be helpful to get the  report of the endoscopy to make sure there were no surprises.  Maybe consider repeating it prior to any consideration of surgery.  Pathophysiology of diverticulitis and its differential diagnosis was discussed with the patient and her husband.  Questions answered.  They agree with the plan.  -VTE prophylaxis- SCDs, etc  -mobilize as tolerated to help recovery  45 minutes spent in review,  evaluation, examination, counseling, and coordination of care.  More than 50% of that time was spent in counseling.  Adin Hector, MD, FACS, MASCRS Gastrointestinal and Minimally Invasive Surgery  [facility] [address], [place], [place] [address] [phone] Main / Paging [phone] Fax     10/30/2019      Past Medical History:  Diagnosis Date  . Fibroids   . Infection of cesarean section or perineal wound 04/24/2011  . No pertinent past medical history     Past Surgical History:  Procedure Laterality Date  . CESAREAN SECTION    . CESAREAN SECTION  04/14/2011   Procedure: CESAREAN SECTION;  Surgeon: Frederico Hamman, MD;  Location: [facility];  Service: Gynecology;  Laterality: N/A;  Repeat of baby girl at 32 APGAR 2/7    Social History   Socioeconomic History  . Marital status: Married    Spouse name: Not on file  . Number of children: 2  . Years of education: Not on file  . Highest education level: Not on file  Occupational History  . Not on file  Tobacco Use  . Smoking status: Never Smoker  . Smokeless tobacco: Never Used  Substance and Sexual Activity  . Alcohol use: Yes    Alcohol/week: 1.0 standard drinks    Types: 1 Glasses of wine per week    Comment: SOCIALLY  . Drug use: No  . Sexual activity: Yes    Birth control/protection: None  Other Topics Concern  . Not on file  Social History Narrative  . Not on file   Social Determinants of Health   Financial Resource Strain:   . Difficulty of Paying Living Expenses:   Food Insecurity:   . Worried About Charity fundraiser in the Last Year:   . Arboriculturist in the Last Year:   Transportation Needs:   . Film/video editor (Medical):   Marland Kitchen Lack of Transportation (Non-Medical):   Physical Activity:   . Days of Exercise per Week:   . Minutes of Exercise per Session:   Stress:   . Feeling of Stress :   Social Connections:   . Frequency of Communication  with Friends and Family:   . Frequency of Social Gatherings with Friends and Family:   . Attends Religious Services:   . Active Member of Clubs or Organizations:   . Attends Archivist Meetings:   Marland Kitchen Marital Status:   Intimate Partner Violence:   . Fear of Current or Ex-Partner:   . Emotionally Abused:   Marland Kitchen Physically Abused:   . Sexually Abused:     Family History  Problem Relation Age of Onset  . Diabetes Mother   . Hypertension Mother   . Cancer Father   . Diabetes Father     Current Facility-Administered Medications  Medication Dose Route Frequency Provider Last Rate Last Admin  . fentaNYL (SUBLIMAZE) injection 75 mcg  75 mcg Intravenous Once McDonald, Mia A, PA-C   Stopped at 10/29/19 2336  . ondansetron (ZOFRAN) injection 4 mg  4 mg Intravenous Once PRN  McDonald, Mia A, PA-C      . piperacillin-tazobactam (ZOSYN) IVPB 3.375 g  3.375 g Intravenous Once McDonald, Mia A, PA-C      . sodium chloride (PF) 0.9 % injection           . sodium chloride flush (NS) 0.9 % injection 3 mL  3 mL Intravenous Once Maudie Flakes, MD       Current Outpatient Medications  Medication Sig Dispense Refill  . b complex vitamins tablet Take 1 tablet by mouth daily.    . cholecalciferol (VITAMIN D3) 25 MCG (1000 UT) tablet Take 1,000 Units by mouth 2 (two) times daily.    . Multiple Vitamins-Minerals (MULTIVITAMIN WITH MINERALS) tablet Take 1 tablet by mouth daily.       Allergies  Allergen Reactions  . Macrobid [Nitrofurantoin Macrocrystal] Hives  . Vicodin [Hydrocodone-Acetaminophen] Itching  . Hydrocodone-Acetaminophen Itching    ROS:   All other systems reviewed & are negative except per HPI or as noted below: Constitutional:  No fevers, chills, sweats.  Weight stable Eyes:  No vision changes, No discharge HENT:  No sore throats, nasal drainage Lymph: No neck swelling, No bruising easily Pulmonary:  No cough, productive sputum CV: No orthopnea, PND  Patient walks 60 minutes  for about 2 miles without difficulty.  No exertional chest/neck/shoulder/arm pain. GI:  No personal nor family history of GI cancer aside from her father who was diagnosed with colon cancer., inflammatory bowel disease, irritable bowel syndrome, allergy such as Celiac Sprue, dietary/dairy problems, colitis, ulcers nor gastritis.  No recent sick contacts/gastroenteritis.  No travel outside the country.  No changes in diet. Renal: No UTIs, No hematuria.  No history of kidney stones Genital:  No drainage, bleeding, masses.  No menorrhagia.  She has a stable fibroid but no issues with pain or endometriosis Musculoskeletal: No severe joint pain.  Good ROM major joints Skin:  No sores or lesions.  No rashes Heme/Lymph:  No easy bleeding.  No swollen lymph nodes Neuro: No focal weakness/numbness.  No seizures Psych: No suicidal ideation.  No hallucinations  BP (!) 141/85   Pulse (!) 102   Temp 98.5 F (36.9 C) (Oral)   Resp 16   Ht 5\' 2"  (1.575 m)   Wt 72.6 kg   LMP 10/04/2019 (Approximate) Comment: negative HCG 10/29/19  SpO2 99%   BMI 29.26 kg/m   Physical Exam: Constitutional: Not cachectic.  Tired but not toxic nor sickly.  Hygeine adequate.  Vitals signs as above.   Eyes: Pupils reactive, normal extraocular movements. Sclera nonicteric Neuro: CN II-XII intact.  No major focal sensory defects.  No major motor deficits. Lymph: No head/neck/groin lymphadenopathy Psych:  No severe agitation.  No severe anxiety.  Judgment & insight Adequate, Oriented x4 HENT: Normocephalic, Mucus membranes moist.  No thrush.   Neck: Supple, No tracheal deviation.  No obvious thyromegaly Chest: No pain to chest wall compression.  Good respiratory excursion.  No audible wheezing CV:  Pulses intact.  Regular rhythm.  No major extremity edema Abdomen: Soft, Nondistended.  Periumbilical and left lower quadrant tenderness to palpation.  Mild guarding.  Mild reproduction of pain with cough but no severe guarding.   No diffuse peritonitis. No incarcerated hernias.  No hepatomegaly.  No splenomegaly Gen:  No inguinal hernias.  No inguinal lymphadenopathy.  Pfannenstiel incision healed Ext: No obvious deformity or contracture no significant edema.  No cyanosis Skin: No major subcutaneous nodules.  Warm and dry Musculoskeletal: Severe joint rigidity  not present.  No obvious clubbing.  No digital petechiae.     Results:   Labs: Results for orders placed or performed during the hospital encounter of 10/29/19 (from the past 48 hour(s))  Lipase, blood     Status: None   Collection Time: 10/29/19  9:36 PM  Result Value Ref Range   Lipase 25 11 - 51 U/L    Comment: Performed at [facility], [place] [address], [place], [place] [address]  Comprehensive metabolic panel     Status: Abnormal   Collection Time: 10/29/19  9:36 PM  Result Value Ref Range   Sodium 138 135 - 145 mmol/L   Potassium 4.6 3.5 - 5.1 mmol/L   Chloride 105 98 - 111 mmol/L   CO2 25 22 - 32 mmol/L   Glucose, Bld 133 (H) 70 - 99 mg/dL    Comment: Glucose reference range applies only to samples taken after fasting for at least 8 hours.   BUN 13 6 - 20 mg/dL   Creatinine, Ser 0.78 0.44 - 1.00 mg/dL   Calcium 8.7 (L) 8.9 - 10.3 mg/dL   Total Protein 7.5 6.5 - 8.1 g/dL   Albumin 4.2 3.5 - 5.0 g/dL   AST 20 15 - 41 U/L   ALT 17 0 - 44 U/L   Alkaline Phosphatase 63 38 - 126 U/L   Total Bilirubin 0.9 0.3 - 1.2 mg/dL   GFR calc non Af Amer >60 >60 mL/min   GFR calc Af Amer >60 >60 mL/min   Anion gap 8 5 - 15    Comment: Performed at [facility], [place] [address] [place], [place] [address]  CBC     Status: Abnormal   Collection Time: 10/29/19  9:36 PM  Result Value Ref Range   WBC 19.2 (H) 4.0 - 10.5 K/uL   RBC 4.04 3.87 - 5.11 MIL/uL   Hemoglobin 13.8 12.0 - 15.0 g/dL   HCT 39.6 36.0 - 46.0 %   MCV 98.0 80.0 - 100.0 fL   MCH 34.2 (H) 26.0 - 34.0 pg   MCHC 34.8 30.0 - 36.0 g/dL   RDW 11.8 11.5 -  15.5 %   Platelets 312 150 - 400 K/uL   nRBC 0.0 0.0 - 0.2 %    Comment: Performed at [facility], [place] [address], [place], [place] [address]  Urinalysis, Routine w reflex microscopic     Status: Abnormal   Collection Time: 10/29/19  9:36 PM  Result Value Ref Range   Color, Urine YELLOW YELLOW   APPearance CLEAR CLEAR   Specific Gravity, Urine 1.025 1.005 - 1.030   pH 6.0 5.0 - 8.0   Glucose, UA NEGATIVE NEGATIVE mg/dL   Hgb urine dipstick NEGATIVE NEGATIVE   Bilirubin Urine NEGATIVE NEGATIVE   Ketones, ur 5 (A) NEGATIVE mg/dL   Protein, ur NEGATIVE NEGATIVE mg/dL   Nitrite NEGATIVE NEGATIVE   Leukocytes,Ua NEGATIVE NEGATIVE    Comment: Performed at [place] [address] [place], [place] [address]  I-Stat beta hCG blood, ED     Status: None   Collection Time: 10/29/19  9:41 PM  Result Value Ref Range   I-stat hCG, quantitative <5.0 <5 mIU/mL   Comment 3            Comment:   GEST. AGE      CONC.  (mIU/mL)   <=1 WEEK        5 - 50  2 WEEKS       50 - 500     3 WEEKS       100 - 10,000     4 WEEKS     1,000 - 30,000        FEMALE AND NON-PREGNANT FEMALE:     LESS THAN 5 mIU/mL     Imaging / Studies: CT ABDOMEN PELVIS W CONTRAST  Result Date: 10/30/2019 CLINICAL DATA:  Sudden onset lower abdominal pain for 2 hours EXAM: CT ABDOMEN AND PELVIS WITH CONTRAST TECHNIQUE: Multidetector CT imaging of the abdomen and pelvis was performed using the standard protocol following bolus administration of intravenous contrast. CONTRAST:  131mL OMNIPAQUE IOHEXOL 300 MG/ML  SOLN COMPARISON:  None. FINDINGS: Lower chest: No acute pleural or parenchymal lung disease. Hepatobiliary: No focal liver abnormality is seen. No gallstones, gallbladder wall thickening, or biliary dilatation. Pancreas: Unremarkable. No pancreatic ductal dilatation or surrounding inflammatory changes. Spleen: Normal in size without focal abnormality. Adrenals/Urinary Tract:  Adrenal glands are unremarkable. Kidneys are normal, without renal calculi, focal lesion, or hydronephrosis. Bladder is unremarkable. Stomach/Bowel: There is abnormal wall thickening of the mid sigmoid colon, with surrounding pericolonic fat stranding. Findings are consistent with acute diverticulitis. There is free gas within the peritoneal cavity consistent with perforated diverticulitis. No fluid collection or abscess. No bowel obstruction or ileus. Normal appendix right lower quadrant. Vascular/Lymphatic: No significant vascular findings are present. No enlarged abdominal or pelvic lymph nodes. Reproductive: Uterus is enlarged, with multiple uterine fibroids. No adnexal masses. Other: There is a small amount of free fluid within the cul-de-sac. No fluid collection or abscess. Pneumoperitoneum is seen throughout the abdomen. Musculoskeletal: No acute or destructive bony lesions. Reconstructed images demonstrate no additional findings. IMPRESSION: 1. Acute sigmoid diverticulitis. There is evidence of perforation, with pneumoperitoneum throughout the abdomen and pelvis. No fluid collection or abscess. 2. Trace pelvic free fluid. 3. Fibroid uterus. These results were called by telephone at the time of interpretation on 10/30/2019 at 12:38 am to provider MIA [facility] , who verbally acknowledged these results. Electronically Signed   By: Randa Ngo M.D.   On: 10/30/2019 00:39    Medications / Allergies: per chart  Antibiotics: Anti-infectives (From admission, onward)   Start     Dose/Rate Route Frequency Ordered Stop   10/30/19 0045  piperacillin-tazobactam (ZOSYN) IVPB 3.375 g     3.375 g 100 mL/hr over 30 Minutes Intravenous  Once 10/30/19 0041          Note: Portions of this report may have been transcribed using voice recognition software. Every effort was made to ensure accuracy; however, inadvertent computerized transcription errors may be present.   Any transcriptional errors that result  from this process are unintentional.    Adin Hector, MD, FACS, MASCRS Gastrointestinal and Minimally Invasive Surgery  [facility] [address] [place], [place] [address] [phone] Main / Paging [phone] Fax     10/30/2019

## 2019-10-31 LAB — BASIC METABOLIC PANEL
Anion gap: 9 (ref 5–15)
BUN: 8 mg/dL (ref 6–20)
CO2: 23 mmol/L (ref 22–32)
Calcium: 8.1 mg/dL — ABNORMAL LOW (ref 8.9–10.3)
Chloride: 105 mmol/L (ref 98–111)
Creatinine, Ser: 0.77 mg/dL (ref 0.44–1.00)
GFR calc Af Amer: >60 mL/min (ref >60)
GFR calc non Af Amer: >60 mL/min (ref >60)
Glucose, Bld: 82 mg/dL (ref 70–99)
Potassium: 3.5 mmol/L (ref 3.5–5.1)
Sodium: 137 mmol/L (ref 135–145)

## 2019-10-31 LAB — CBC
HCT: 36.9 % (ref 36.0–46.0)
Hemoglobin: 12.1 g/dL (ref 12.0–15.0)
MCH: 33.3 pg (ref 26.0–34.0)
MCHC: 32.8 g/dL (ref 30.0–36.0)
MCV: 101.7 fL — ABNORMAL HIGH (ref 80.0–100.0)
Platelets: 285 10*3/uL (ref 150–400)
RBC: 3.63 MIL/uL — ABNORMAL LOW (ref 3.87–5.11)
RDW: 12 % (ref 11.5–15.5)
WBC: 20.5 10*3/uL — ABNORMAL HIGH (ref 4.0–10.5)
nRBC: 0 % (ref 0.0–0.2)

## 2019-10-31 NOTE — Progress Notes (Signed)
Pt is in the Yellow MEWS for an increased HR. Pt has previously been in the Yellow MEWS for the same reason. MD aware. RN administered prn Tylenol and Mylicon for pain and stomach cramping. Yellow MEWS will not be implemented at this time. Will continue to monitor.

## 2019-10-31 NOTE — Progress Notes (Signed)
Subjective/Chief Complaint: She reports less pain overall and she is having loose BM's   Objective: Vital signs in last 24 hours: Temp:  [99.2 F (37.3 C)-99.7 F (37.6 C)] 99.4 F (37.4 C) (04/17 0542) Pulse Rate:  [102-109] 109 (04/17 0542) Resp:  [18-19] 19 (04/17 0542) BP: (115-136)/(67-80) 115/67 (04/17 0542) SpO2:  [100 %] 100 % (04/17 0542) Last BM Date: 10/30/19  Intake/Output from previous day: 04/16 0701 - 04/17 0700 In: 1134.3 [I.V.:1034.3; IV Piggyback:100] Out: -  Intake/Output this shift: No intake/output data recorded.  Exam: Awake and alert, comfortable Abdomen soft, still with LLQ and suprapubic tenderness and guarding  Lab Results:  Recent Labs    10/30/19 0724 10/31/19 0558  WBC 23.7* 20.5*  HGB 12.6 12.1  HCT 36.2 36.9  PLT 304 285   BMET Recent Labs    10/30/19 0724 10/31/19 0558  NA 136 137  K 3.9 3.5  CL 106 105  CO2 22 23  GLUCOSE 135* 82  BUN 8 8  CREATININE 0.68 0.77  CALCIUM 8.3* 8.1*   PT/INR No results for input(s): LABPROT, INR in the last 72 hours. ABG No results for input(s): PHART, HCO3 in the last 72 hours.  Invalid input(s): PCO2, PO2  Studies/Results: CT ABDOMEN PELVIS W CONTRAST  Result Date: 10/30/2019 CLINICAL DATA:  Sudden onset lower abdominal pain for 2 hours EXAM: CT ABDOMEN AND PELVIS WITH CONTRAST TECHNIQUE: Multidetector CT imaging of the abdomen and pelvis was performed using the standard protocol following bolus administration of intravenous contrast. CONTRAST:  170mL OMNIPAQUE IOHEXOL 300 MG/ML  SOLN COMPARISON:  None. FINDINGS: Lower chest: No acute pleural or parenchymal lung disease. Hepatobiliary: No focal liver abnormality is seen. No gallstones, gallbladder wall thickening, or biliary dilatation. Pancreas: Unremarkable. No pancreatic ductal dilatation or surrounding inflammatory changes. Spleen: Normal in size without focal abnormality. Adrenals/Urinary Tract: Adrenal glands are unremarkable.  Kidneys are normal, without renal calculi, focal lesion, or hydronephrosis. Bladder is unremarkable. Stomach/Bowel: There is abnormal wall thickening of the mid sigmoid colon, with surrounding pericolonic fat stranding. Findings are consistent with acute diverticulitis. There is free gas within the peritoneal cavity consistent with perforated diverticulitis. No fluid collection or abscess. No bowel obstruction or ileus. Normal appendix right lower quadrant. Vascular/Lymphatic: No significant vascular findings are present. No enlarged abdominal or pelvic lymph nodes. Reproductive: Uterus is enlarged, with multiple uterine fibroids. No adnexal masses. Other: There is a small amount of free fluid within the cul-de-sac. No fluid collection or abscess. Pneumoperitoneum is seen throughout the abdomen. Musculoskeletal: No acute or destructive bony lesions. Reconstructed images demonstrate no additional findings. IMPRESSION: 1. Acute sigmoid diverticulitis. There is evidence of perforation, with pneumoperitoneum throughout the abdomen and pelvis. No fluid collection or abscess. 2. Trace pelvic free fluid. 3. Fibroid uterus. These results were called by telephone at the time of interpretation on 10/30/2019 at 12:38 am to provider [facility]IA [facility] , who verbally acknowledged these results. Electronically Signed   By: Randa Ngo M.D.   On: 10/30/2019 00:39    Anti-infectives: Anti-infectives (From admission, onward)   Start     Dose/Rate Route Frequency Ordered Stop   10/30/19 0730  piperacillin-tazobactam (ZOSYN) IVPB 3.375 g     3.375 g 12.5 mL/hr over 240 Minutes Intravenous Every 8 hours 10/30/19 0145     10/30/19 0045  piperacillin-tazobactam (ZOSYN) IVPB 3.375 g     3.375 g 100 mL/hr over 30 Minutes Intravenous  Once 10/30/19 0041 10/30/19 0154      Assessment/Plan:  Sigmoid diverticulitis with contained perforation  Clinically improving. Will continue IV antibiotics. Repeat CBC in the morning Start  clear liquids   LOS: 1 day    Coralie Keens MD 10/31/2019

## 2019-11-01 LAB — CBC
HCT: 40.9 % (ref 36.0–46.0)
Hemoglobin: 13.8 g/dL (ref 12.0–15.0)
MCH: 33.2 pg (ref 26.0–34.0)
MCHC: 33.7 g/dL (ref 30.0–36.0)
MCV: 98.3 fL (ref 80.0–100.0)
Platelets: 333 10*3/uL (ref 150–400)
RBC: 4.16 MIL/uL (ref 3.87–5.11)
RDW: 11.9 % (ref 11.5–15.5)
WBC: 22.5 10*3/uL — ABNORMAL HIGH (ref 4.0–10.5)
nRBC: 0 % (ref 0.0–0.2)

## 2019-11-01 NOTE — Progress Notes (Addendum)
   Subjective/Chief Complaint: Report less abdominal pain this morning Having more loose BM's Tolerated clear liquids   Objective: Vital signs in last 24 hours: Temp:  [98.3 F (36.8 C)-100 F (37.8 C)] 98.3 F (36.8 C) (04/18 0537) Pulse Rate:  [98-142] 121 (04/18 0537) Resp:  [18-20] 18 (04/18 0537) BP: (125-144)/(84-86) 125/84 (04/18 0537) SpO2:  [99 %-100 %] 99 % (04/18 0537) Last BM Date: 10/31/19  Intake/Output from previous day: 04/17 0701 - 04/18 0700 In: 1383.1 [P.O.:480; I.V.:821.7; IV Piggyback:81.4] Out: 1890 [Urine:1890] Intake/Output this shift: No intake/output data recorded.  Exam: Awake and alert HR back in the 90's Abdomen soft, non-distended, less tenderness and guarding in the lower abdomen  Lab Results:  Recent Labs    10/30/19 0724 10/31/19 0558  WBC 23.7* 20.5*  HGB 12.6 12.1  HCT 36.2 36.9  PLT 304 285   BMET Recent Labs    10/30/19 0724 10/31/19 0558  NA 136 137  K 3.9 3.5  CL 106 105  CO2 22 23  GLUCOSE 135* 82  BUN 8 8  CREATININE 0.68 0.77  CALCIUM 8.3* 8.1*   PT/INR No results for input(s): LABPROT, INR in the last 72 hours. ABG No results for input(s): PHART, HCO3 in the last 72 hours.  Invalid input(s): PCO2, PO2  Studies/Results: No results found.  Anti-infectives: Anti-infectives (From admission, onward)   Start     Dose/Rate Route Frequency Ordered Stop   10/30/19 0730  piperacillin-tazobactam (ZOSYN) IVPB 3.375 g     3.375 g 12.5 mL/hr over 240 Minutes Intravenous Every 8 hours 10/30/19 0145     10/30/19 0045  piperacillin-tazobactam (ZOSYN) IVPB 3.375 g     3.375 g 100 mL/hr over 30 Minutes Intravenous  Once 10/30/19 0041 10/30/19 0154      Assessment/Plan: Perforated sigmoid diverticulitis  Clinically improving Repeat CBC this morning Full liquid diet Continue IV antibiotics   LOS: 2 days    Coralie Keens 11/01/2019

## 2019-11-01 NOTE — Progress Notes (Signed)
Pt still in Yellow MEWS for elevated HR. MD aware. Yellow MEWS will not be implemented at this time

## 2019-11-02 LAB — CBC
HCT: 35.3 % — ABNORMAL LOW (ref 36.0–46.0)
Hemoglobin: 12 g/dL (ref 12.0–15.0)
MCH: 33.3 pg (ref 26.0–34.0)
MCHC: 34 g/dL (ref 30.0–36.0)
MCV: 98.1 fL (ref 80.0–100.0)
Platelets: 318 10*3/uL (ref 150–400)
RBC: 3.6 MIL/uL — ABNORMAL LOW (ref 3.87–5.11)
RDW: 11.9 % (ref 11.5–15.5)
WBC: 19 10*3/uL — ABNORMAL HIGH (ref 4.0–10.5)
nRBC: 0 % (ref 0.0–0.2)

## 2019-11-02 NOTE — Progress Notes (Addendum)
   Subjective/Chief Complaint: Reports feeling hungry. Denies pain - endorses abdominal "Discomfort" that occurs right before BMs. Denies increased pain with drinking/eating FLD. +flatus and non-bloody BMs.   Afebrile, HR 105 bpm WBC 19 from 22 Objective: Vital signs in last 24 hours: Temp:  [99.1 F (37.3 C)-99.7 F (37.6 C)] 99.2 F (37.3 C) (04/19 0537) Pulse Rate:  [105-121] 105 (04/19 0537) Resp:  [18-19] 19 (04/19 0537) BP: (129-140)/(76-82) 129/79 (04/19 0537) SpO2:  [100 %] 100 % (04/19 0537) Last BM Date: 11/01/19  Intake/Output from previous day: 04/18 0701 - 04/19 0700 In: 700 [P.O.:700] Out: 150 [Urine:150] Intake/Output this shift: No intake/output data recorded.  Exam: Awake and alert CV: RRR Pulm: Normal effort Abd: soft, mild distention, mild TTP suprapubic region without guarding, no peritonitis   Lab Results:  Recent Labs    11/01/19 0806 11/02/19 0551  WBC 22.5* 19.0*  HGB 13.8 12.0  HCT 40.9 35.3*  PLT 333 318   BMET Recent Labs    10/31/19 0558  NA 137  K 3.5  CL 105  CO2 23  GLUCOSE 82  BUN 8  CREATININE 0.77  CALCIUM 8.1*   PT/INR No results for input(s): LABPROT, INR in the last 72 hours. ABG No results for input(s): PHART, HCO3 in the last 72 hours.  Invalid input(s): PCO2, PO2  Studies/Results: No results found.  Anti-infectives: Anti-infectives (From admission, onward)   Start     Dose/Rate Route Frequency Ordered Stop   10/30/19 0730  piperacillin-tazobactam (ZOSYN) IVPB 3.375 g     3.375 g 12.5 mL/hr over 240 Minutes Intravenous Every 8 hours 10/30/19 0145     10/30/19 0045  piperacillin-tazobactam (ZOSYN) IVPB 3.375 g     3.375 g 100 mL/hr over 30 Minutes Intravenous  Once 10/30/19 0041 10/30/19 0154      Assessment/Plan: Perforated sigmoid diverticulitis CT A/P 4/16 w/ pneumoperitoneum, no abscess   Clinically improving WBC 19 from 22, afebrile, still with intermittent sinus tachycardia Continue IV  antibiotics Continue FLD CBC in AM - repeat CT A/P tomorrow if patient clinically deteriorates or WBC remains high  FEN: FLD ID: Zosyn VTE: SCDs, Lovenox   LOS: 3 days    Jill Alexanders 11/02/2019

## 2019-11-03 LAB — CBC
HCT: 33.9 % — ABNORMAL LOW (ref 36.0–46.0)
Hemoglobin: 11.7 g/dL — ABNORMAL LOW (ref 12.0–15.0)
MCH: 34 pg (ref 26.0–34.0)
MCHC: 34.5 g/dL (ref 30.0–36.0)
MCV: 98.5 fL (ref 80.0–100.0)
Platelets: 329 10*3/uL (ref 150–400)
RBC: 3.44 MIL/uL — ABNORMAL LOW (ref 3.87–5.11)
RDW: 11.9 % (ref 11.5–15.5)
WBC: 16.3 10*3/uL — ABNORMAL HIGH (ref 4.0–10.5)
nRBC: 0 % (ref 0.0–0.2)

## 2019-11-03 MED ORDER — NYSTATIN 100000 UNIT/ML MT SUSP
5.0000 mL | Freq: Four times a day (QID) | OROMUCOSAL | Status: DC
  Administered 2019-11-03 – 2019-11-05 (×10): 500000 [IU] via ORAL
  Filled 2019-11-03 (×10): qty 5

## 2019-11-03 MED ORDER — PIPERACILLIN-TAZOBACTAM 3.375 G IVPB
3.3750 g | Freq: Three times a day (TID) | INTRAVENOUS | Status: DC
  Administered 2019-11-03 – 2019-11-05 (×6): 3.375 g via INTRAVENOUS
  Filled 2019-11-03 (×8): qty 50

## 2019-11-03 NOTE — Progress Notes (Signed)
   Subjective/Chief Complaint: Pain improving. Continues to have flatus and non-bloody BMs. Denies fever or chills. No reported urinary sxs or chest pain/SOB.  Afebrile, HR 101 bpm WBC 16 from 19  ROS negative except as above Objective: Vital signs in last 24 hours: Temp:  [98.2 F (36.8 C)-98.5 F (36.9 C)] 98.5 F (36.9 C) (04/20 0449) Pulse Rate:  [98-113] 101 (04/20 0449) Resp:  [16-18] 16 (04/20 0449) BP: (130-148)/(78-85) 135/78 (04/20 0449) SpO2:  [98 %-100 %] 99 % (04/20 0449) Last BM Date: 11/01/19  Intake/Output from previous day: 04/19 0701 - 04/20 0700 In: 994.7 [P.O.:480; I.V.:144.1; IV Piggyback:370.6] Out: 600 [Urine:600] Intake/Output this shift: No intake/output data recorded.  Exam: Awake and alert CV: RRR Pulm: Normal effort Abd: soft, mild distention, mostly subjective TTP suprapubic region without guarding, no peritonitis   Lab Results:  Recent Labs    11/02/19 0551 11/03/19 0624  WBC 19.0* 16.3*  HGB 12.0 11.7*  HCT 35.3* 33.9*  PLT 318 329   BMET No results for input(s): NA, K, CL, CO2, GLUCOSE, BUN, CREATININE, CALCIUM in the last 72 hours. PT/INR No results for input(s): LABPROT, INR in the last 72 hours. ABG No results for input(s): PHART, HCO3 in the last 72 hours.  Invalid input(s): PCO2, PO2  Studies/Results: No results found.  Anti-infectives: Anti-infectives (From admission, onward)   Start     Dose/Rate Route Frequency Ordered Stop   10/30/19 0730  piperacillin-tazobactam (ZOSYN) IVPB 3.375 g     3.375 g 12.5 mL/hr over 240 Minutes Intravenous Every 8 hours 10/30/19 0145     10/30/19 0045  piperacillin-tazobactam (ZOSYN) IVPB 3.375 g     3.375 g 100 mL/hr over 30 Minutes Intravenous  Once 10/30/19 0041 10/30/19 0154      Assessment/Plan: Perforated sigmoid diverticulitis CT A/P 4/16 w/ pneumoperitoneum, no abscess   Clinically improving WBC 16 from 19, afebrile, still with sinus tachycardia Continue IV  antibiotics Advance to SOFT diet CBC in AM - repeat CT A/P tomorrow if patient clinically deteriorates or WBC remains high  FEN: SOFT ID: Zosyn VTE: SCDs, Lovenox   LOS: 4 days    Jill Alexanders 11/03/2019

## 2019-11-04 LAB — CBC
HCT: 33.9 % — ABNORMAL LOW (ref 36.0–46.0)
Hemoglobin: 11.6 g/dL — ABNORMAL LOW (ref 12.0–15.0)
MCH: 33.4 pg (ref 26.0–34.0)
MCHC: 34.2 g/dL (ref 30.0–36.0)
MCV: 97.7 fL (ref 80.0–100.0)
Platelets: 343 10*3/uL (ref 150–400)
RBC: 3.47 MIL/uL — ABNORMAL LOW (ref 3.87–5.11)
RDW: 11.8 % (ref 11.5–15.5)
WBC: 15.2 10*3/uL — ABNORMAL HIGH (ref 4.0–10.5)
nRBC: 0 % (ref 0.0–0.2)

## 2019-11-04 NOTE — Progress Notes (Signed)
   Subjective/Chief Complaint: Denies abdominal pain. Tolerated a small amount of SOFT foods yesterday (applesauce, chicken soup, mashed potatoes) without worsening pain. Endorses mild abdominal cramping before BMs but not pain at rest. Having 8+ loose brown BMs daily - denies blood or pus in stool. Denies fever, chills, nausea, vomiting. Mobilizing daily.   Afebrile, HR 90 bpm WBC 15 from 16  ROS negative except as above Objective: Vital signs in last 24 hours: Temp:  [98.1 F (36.7 C)-98.6 F (37 C)] 98.6 F (37 C) (04/21 0551) Pulse Rate:  [90-98] 90 (04/21 0551) Resp:  [16-18] 16 (04/21 0551) BP: (129-141)/(81-86) 135/86 (04/21 0551) SpO2:  [99 %-100 %] 99 % (04/21 0551) Last BM Date: 11/01/19  Intake/Output from previous day: 04/20 0701 - 04/21 0700 In: 1586.3 [P.O.:480; I.V.:1000; IV Piggyback:106.3] Out: 800 [Urine:800] Intake/Output this shift: No intake/output data recorded.  Exam: Awake and alert CV: RRR Pulm: Normal effort Abd: soft, non-tender, non-distention, no rebound/guarding, no hepatosplenomegaly  Lab Results:  Recent Labs    11/03/19 0624 11/04/19 0616  WBC 16.3* 15.2*  HGB 11.7* 11.6*  HCT 33.9* 33.9*  PLT 329 343   Studies/Results: No results found.  Anti-infectives: Anti-infectives (From admission, onward)   Start     Dose/Rate Route Frequency Ordered Stop   11/03/19 1500  piperacillin-tazobactam (ZOSYN) IVPB 3.375 g     3.375 g 12.5 mL/hr over 240 Minutes Intravenous Every 8 hours 11/03/19 1432     10/30/19 0730  piperacillin-tazobactam (ZOSYN) IVPB 3.375 g  Status:  Discontinued     3.375 g 12.5 mL/hr over 240 Minutes Intravenous Every 8 hours 10/30/19 0145 11/03/19 1432   10/30/19 0045  piperacillin-tazobactam (ZOSYN) IVPB 3.375 g     3.375 g 100 mL/hr over 30 Minutes Intravenous  Once 10/30/19 0041 10/30/19 0154      Assessment/Plan: Perforated sigmoid diverticulitis CT A/P 4/16 w/ pneumoperitoneum, no abscess  Clinically  improving WBC 15 from 16, afebrile, sinus tachycardia resolved Continue IV antibiotics - will discuss transition to PO abx with MD Continue SOFT diet CBC/BMP in AM  FEN: SOFT, saline lock IVF ID: Zosyn VTE: SCDs, Lovenox   LOS: 5 days    Monica Hendrix 11/04/2019

## 2019-11-05 LAB — BASIC METABOLIC PANEL
Anion gap: 11 (ref 5–15)
BUN: 8 mg/dL (ref 6–20)
CO2: 25 mmol/L (ref 22–32)
Calcium: 8.3 mg/dL — ABNORMAL LOW (ref 8.9–10.3)
Chloride: 102 mmol/L (ref 98–111)
Creatinine, Ser: 0.65 mg/dL (ref 0.44–1.00)
GFR calc Af Amer: >60 mL/min (ref >60)
GFR calc non Af Amer: >60 mL/min (ref >60)
Glucose, Bld: 101 mg/dL — ABNORMAL HIGH (ref 70–99)
Potassium: 3.7 mmol/L (ref 3.5–5.1)
Sodium: 138 mmol/L (ref 135–145)

## 2019-11-05 LAB — CBC
HCT: 36.3 % (ref 36.0–46.0)
Hemoglobin: 12.3 g/dL (ref 12.0–15.0)
MCH: 33.1 pg (ref 26.0–34.0)
MCHC: 33.9 g/dL (ref 30.0–36.0)
MCV: 97.6 fL (ref 80.0–100.0)
Platelets: 384 10*3/uL (ref 150–400)
RBC: 3.72 MIL/uL — ABNORMAL LOW (ref 3.87–5.11)
RDW: 11.8 % (ref 11.5–15.5)
WBC: 13.7 10*3/uL — ABNORMAL HIGH (ref 4.0–10.5)
nRBC: 0 % (ref 0.0–0.2)

## 2019-11-05 MED ORDER — POTASSIUM CHLORIDE CRYS ER 20 MEQ PO TBCR
40.0000 meq | EXTENDED_RELEASE_TABLET | Freq: Once | ORAL | Status: AC
  Administered 2019-11-05: 40 meq via ORAL
  Filled 2019-11-05: qty 2

## 2019-11-05 MED ORDER — AMOXICILLIN-POT CLAVULANATE 875-125 MG PO TABS
1.0000 | ORAL_TABLET | Freq: Two times a day (BID) | ORAL | Status: DC
  Administered 2019-11-05 – 2019-11-06 (×3): 1 via ORAL
  Filled 2019-11-05 (×3): qty 1

## 2019-11-05 NOTE — Progress Notes (Signed)
   Subjective/Chief Complaint: Denies abdominal pain. Tolerating SOFT foods. Reports frequency of BMs is decreasing and stools are becoming more formed. Denies fever, chills, nausea, vomiting. No reported urinary sxs.   Afebrile, HR 90 bpm WBC 13.6 from 15  ROS negative except as above Objective: Vital signs in last 24 hours: Temp:  [98.3 F (36.8 C)-98.6 F (37 C)] 98.6 F (37 C) (04/22 0445) Pulse Rate:  [97-100] 100 (04/22 0445) Resp:  [17] 17 (04/22 0445) BP: (133-134)/(78-81) 133/78 (04/22 0445) SpO2:  [98 %] 98 % (04/22 0445) Last BM Date: 11/04/19  Intake/Output from previous day: 04/21 0701 - 04/22 0700 In: -  Out: 1500 [Urine:1500] Intake/Output this shift: No intake/output data recorded.  Exam: Awake and alert CV: RRR Pulm: Normal effort Abd: soft, non-tender, non-distention, no rebound/guarding, no hepatosplenomegaly  Lab Results:  Recent Labs    11/04/19 0616 11/05/19 0609  WBC 15.2* 13.7*  HGB 11.6* 12.3  HCT 33.9* 36.3  PLT 343 384   Studies/Results: No results found.  Anti-infectives: Anti-infectives (From admission, onward)   Start     Dose/Rate Route Frequency Ordered Stop   11/03/19 1500  piperacillin-tazobactam (ZOSYN) IVPB 3.375 g     3.375 g 12.5 mL/hr over 240 Minutes Intravenous Every 8 hours 11/03/19 1432     10/30/19 0730  piperacillin-tazobactam (ZOSYN) IVPB 3.375 g  Status:  Discontinued     3.375 g 12.5 mL/hr over 240 Minutes Intravenous Every 8 hours 10/30/19 0145 11/03/19 1432   10/30/19 0045  piperacillin-tazobactam (ZOSYN) IVPB 3.375 g     3.375 g 100 mL/hr over 30 Minutes Intravenous  Once 10/30/19 0041 10/30/19 0154      Assessment/Plan: Perforated sigmoid diverticulitis CT A/P 4/16 w/ pneumoperitoneum, no abscess  Clinically improving WBC 13.6 from 15, AFVSS Continue IV antibiotics Continue SOFT diet CBC in AM  FEN: SOFT, saline lock IVF ID: Zosyn VTE: SCDs, Lovenox  Dispo: anticipate discharge home  tomorrow on oral antibiotics if WBC is still downtrending   LOS: 6 days    Jill Alexanders 11/05/2019

## 2019-11-06 LAB — CBC
HCT: 35.7 % — ABNORMAL LOW (ref 36.0–46.0)
Hemoglobin: 12.2 g/dL (ref 12.0–15.0)
MCH: 33.4 pg (ref 26.0–34.0)
MCHC: 34.2 g/dL (ref 30.0–36.0)
MCV: 97.8 fL (ref 80.0–100.0)
Platelets: 425 10*3/uL — ABNORMAL HIGH (ref 150–400)
RBC: 3.65 MIL/uL — ABNORMAL LOW (ref 3.87–5.11)
RDW: 11.7 % (ref 11.5–15.5)
WBC: 12.4 10*3/uL — ABNORMAL HIGH (ref 4.0–10.5)
nRBC: 0 % (ref 0.0–0.2)

## 2019-11-06 LAB — CREATININE, SERUM
Creatinine, Ser: 0.64 mg/dL (ref 0.44–1.00)
GFR calc Af Amer: >60 mL/min (ref >60)
GFR calc non Af Amer: >60 mL/min (ref >60)

## 2019-11-06 MED ORDER — ACETAMINOPHEN 325 MG PO TABS: 650.0000 mg | ORAL_TABLET | Freq: Four times a day (QID) | ORAL | Status: DC | PRN

## 2019-11-06 MED ORDER — FLUCONAZOLE 150 MG PO TABS: 150.0000 mg | ORAL_TABLET | Freq: Once | ORAL | 0 refills | Status: AC

## 2019-11-06 MED ORDER — AMOXICILLIN-POT CLAVULANATE 875-125 MG PO TABS: 1.0000 | ORAL_TABLET | Freq: Two times a day (BID) | ORAL | 0 refills | Status: AC

## 2019-11-06 MED FILL — AMOX-CLAV 875-125 MG TABLET: 875-125 | 14 days supply | Qty: 28 | Fill #0

## 2019-11-06 MED FILL — FLUCONAZOLE 150 MG TABS: 150 | 2 days supply | Qty: 2 | Fill #0

## 2019-11-06 NOTE — Discharge Instructions (Signed)
Diverticulitis  Diverticulitis is when small pockets in your large intestine (colon) get infected or swollen. This causes stomach pain and watery poop (diarrhea). These pouches are called diverticula. They form in people who have a condition called diverticulosis. Follow these instructions at home: Medicines  Take over-the-counter and prescription medicines only as told by your doctor. These include: ? Antibiotics. ? Pain medicines. ? Fiber pills. ? Probiotics. ? Stool softeners.   Do not drive or use heavy machinery while taking prescription pain medicine.  If you were prescribed an antibiotic, take it as told. Do not stop taking it even if you feel better. General instructions   Follow a diet as told by your doctor.  When you feel better, your doctor may tell you to change your diet. You may need to eat a lot of fiber. Fiber makes it easier to poop (have bowel movements). Healthy foods with fiber include: ? Berries. ? Beans. ? Lentils. ? Green vegetables.  Exercise 3 or more times a week. Aim for 30 minutes each time. Exercise enough to sweat and make your heart beat faster.  Keep all follow-up visits as told. This is important. You may need to have an exam of the large intestine. This is called a colonoscopy. Contact a doctor if:  Your pain does not get better.  You have a hard time eating or drinking.  You are not pooping like normal. Get help right away if:  Your pain gets worse.  Your problems do not get better.  Your problems get worse very fast.  You have a fever.  You throw up (vomit) more than one time.  You have poop that is: ? Bloody. ? Black. ? Tarry. Summary  Diverticulitis is when small pockets in your large intestine (colon) get infected or swollen.  Take medicines only as told by your doctor.  Follow a diet as told by your doctor. This information is not intended to replace advice given to you by your health care provider. Make sure you  discuss any questions you have with your health care provider. Document Revised: 06/14/2017 Document Reviewed: 07/19/2016 Elsevier Patient Education  [place] DIET FOR ABOUT 1-2 WEEKS, THEN TRANSITION TO A HIGH FIBER DIET.   Low-Fiber Eating Plan Fiber is found in fruits, vegetables, whole grains, and beans. Eating a diet low in fiber helps to reduce how often you have bowel movements and how much you produce during a bowel movement. A low-fiber eating plan may help your digestive system heal if:  You have certain conditions, such as Crohn's disease or diverticulitis.  You recently had radiation therapy on your pelvis or bowel.  You recently had intestinal surgery.  You have a new surgical opening in your abdomen (colostomy or ileostomy).  Your intestine is narrowed (stricture). Your health care provider will determine how long you need to stay on this diet. Your health care provider may recommend that you work with a diet and nutrition specialist (dietitian). What are tips for following this plan? General guidelines  Follow recommendations from your dietitian about how much fiber you should have each day.  Most people on this eating plan should try to eat less than 10 grams (g) of fiber each day. Your daily fiber goal is _________________ g.  Take vitamin and mineral supplements as told by your health care provider or dietitian. Chewable or liquid forms are best when on this eating plan. Reading food labels  Check food labels for the amount  of dietary fiber.  Choose foods that have less than 2 grams of fiber in one serving. Cooking  Use white flour and other allowed grains for baking and cooking.  Cook meat using methods that keep it tender, such as braising or poaching.  Cook eggs until the yolk is completely solid.  Cook with healthy oils, such as olive oil or canola oil. Meal planning   Eat 5-6 small meals throughout the day instead of 3 large  meals.  If you are lactose intolerant: ? Choose low-lactose dairy foods. ? Do not eat dairy foods, if told by your dietitian.  Limit fat and oils to less than 8 teaspoons a day.  Eat small portions of desserts. What foods are allowed? The items listed below may not be a complete list. Talk with your dietitian about what dietary choices are best for you. Grains All bread and crackers made with white flour. Waffles, pancakes, and [place] toast. Bagels. Pretzels. Melba toast, zwieback, and matzoh. Cooked and dried cereals that do not contain whole grains, added fiber, seeds, or dried fruit. CornmealDomenick Hendrix. Hot and cold cereals made with refined corn, wheat, rice, or oats. Plain pasta and noodles. White rice. Vegetables Well-cooked or canned vegetables without skin, seeds, or stems. Cooked potatoes without skins. Vegetable juice. Fruits Soft-cooked or canned fruits without skin and seeds. Peeled ripe banana. Applesauce. Fruit juice without pulp. Meats and other protein foods Ground meat. Tender cuts of meat or poultry. Eggs. Fish, seafood, and shellfish. Smooth nut butters. Tofu. Dairy All milk products and drinks. Lactose-free milks, including rice, soy, and almond milks. Yogurt without fruit, nuts, chocolate, or granola mix-ins. Sour cream. Cottage cheese. Cheese. Beverages Decaf coffee. Fruit and vegetable juices or smoothies (in small amounts, with no pulp or skins, and with fruits from allowed list). Sports drinks. Herbal tea. Fats and oils Olive oil, canola oil, sunflower oil, flaxseed oil, and grapeseed oil. Mayonnaise. Cream cheese. Margarine. Butter. Sweets and desserts Plain cakes and cookies. Cream pies and pies made with allowed fruits. Pudding. Custard. Fruit gelatin. Sherbet. Popsicles. Ice cream without nuts. Plain hard candy. Honey. Jelly. Molasses. Syrups, including chocolate syrup. Chocolate. Marshmallows. Gumdrops. Seasoning and other foods Bouillon. Broth. Cream soups  made from allowed foods. Strained soup. Casseroles made with allowed foods. Ketchup. Mild mustard. Mild salad dressings. Plain gravies. Vinegar. Spices in moderation. Salt. Sugar. What foods are not allowed? The items listed below may not be a complete list. Talk with your dietitian about what dietary choices are best for you. Grains Whole wheat and whole grain breads and crackers. Multigrain breads and crackers. Rye bread. Whole grain or multigrain cereals. Cereals with nuts, raisins, or coconut. Bran. Coarse wheat cereals. Granola. High-fiber cereals. Cornmeal or corn bread. Whole grain pasta. Wild or brown rice. Quinoa. Popcorn. Buckwheat. Wheat germ. Vegetables Potato skins. Raw or undercooked vegetables. All beans and bean sprouts. Cooked greens. Corn. Peas. Cabbage. Beets. Broccoli. Brussels sprouts. Cauliflower. Mushrooms. Onions. Peppers. Parsnips. Okra. Sauerkraut. Fruit Raw or dried fruit. Berries. Fruit juice with pulp. Prune juice. Meats and other protein foods Tough, fibrous meats with gristle. Fatty meat. Poultry with skin. Fried meat, Sales executive, or fish. Deli or lunch meats. Sausage, bacon, and hot dogs. Nuts and chunky nut butter. Dried peas, beans, and lentils. Dairy Yogurt with fruit, nuts, chocolate, or granola mix-ins. Beverages Caffeinated coffee and teas. Fats and oils Avocado. Coconut. Sweets and desserts Desserts, cookies, or candies that contain nuts or coconut. Dried fruit. Jams and preserves with seeds. Marmalade. Any  dessert made with fruits or grains that are not allowed. Seasoning and other foods Corn tortilla chips. Soups made with vegetables or grains that are not allowed. Relish. Horseradish. Angie Fava. Olives. Summary  Most people on a low-fiber eating plan should eat less than 10 grams of fiber a day. Follow recommendations from your dietitian about how much fiber you should have each day.  Always check food labels to see the dietary fiber content of packaged  foods. In general, a low-fiber food will have fewer than 2 grams of fiber per serving.  In general, try to avoid whole grains, raw fruits and vegetables, dried fruit, tough cuts of meat, nuts, and seeds.  Take a vitamin and mineral supplement as told by your health care provider or dietitian. This information is not intended to replace advice given to you by your health care provider. Make sure you discuss any questions you have with your health care provider. Document Revised: 10/24/2018 Document Reviewed: 09/04/2016 Elsevier Patient Education  [place].    High-Fiber Diet Fiber, also called dietary fiber, is a type of carbohydrate that is found in fruits, vegetables, whole grains, and beans. A high-fiber diet can have many health benefits. Your health care provider may recommend a high-fiber diet to help:  Prevent constipation. Fiber can make your bowel movements more regular.  Lower your cholesterol.  Relieve the following conditions: ? Swelling of veins in the anus (hemorrhoids). ? Swelling and irritation (inflammation) of specific areas of the digestive tract (uncomplicated diverticulosis). ? A problem of the large intestine (colon) that sometimes causes pain and diarrhea (irritable bowel syndrome, IBS).  Prevent overeating as part of a weight-loss plan.  Prevent heart disease, type 2 diabetes, and certain cancers. What is my plan? The recommended daily fiber intake in grams (g) includes:  38 g for men age 11 or younger.  30 g for men over age 52.  60 g for women age 29 or younger.  21 g for women over age 64. You can get the recommended daily intake of dietary fiber by:  Eating a variety of fruits, vegetables, grains, and beans.  Taking a fiber supplement, if it is not possible to get enough fiber through your diet. What do I need to know about a high-fiber diet?  It is better to get fiber through food sources rather than from fiber supplements. There is not  a lot of research about how effective supplements are.  Always check the fiber content on the nutrition facts label of any prepackaged food. Look for foods that contain 5 g of fiber or more per serving.  Talk with a diet and nutrition specialist (dietitian) if you have questions about specific foods that are recommended or not recommended for your medical condition, especially if those foods are not listed below.  Gradually increase how much fiber you consume. If you increase your intake of dietary fiber too quickly, you may have bloating, cramping, or gas.  Drink plenty of water. Water helps you to digest fiber. What are tips for following this plan?  Eat a wide variety of high-fiber foods.  Make sure that half of the grains that you eat each day are whole grains.  Eat breads and cereals that are made with whole-grain flour instead of refined flour or white flour.  Eat brown rice, bulgur wheat, or millet instead of white rice.  Start the day with a breakfast that is high in fiber, such as a cereal that contains 5 g of fiber  or more per serving.  Use beans in place of meat in soups, salads, and pasta dishes.  Eat high-fiber snacks, such as berries, raw vegetables, nuts, and popcorn.  Choose whole fruits and vegetables instead of processed forms like juice or sauce. What foods can I eat?  Fruits Berries. Pears. Apples. Oranges. Avocado. Prunes and raisins. Dried figs. Vegetables Sweet potatoes. Spinach. Kale. Artichokes. Cabbage. Broccoli. Cauliflower. Green peas. Carrots. Squash. Grains Whole-grain breads. Multigrain cereal. Oats and oatmeal. Brown rice. Barley. Bulgur wheat. [place]. Quinoa. Bran muffins. Popcorn. Rye wafer crackers. Meats and other proteins Navy, kidney, and pinto beans. Soybeans. Split peas. Lentils. Nuts and seeds. Dairy Fiber-fortified yogurt. Beverages Fiber-fortified soy milk. Fiber-fortified orange juice. Other foods Fiber bars. The items listed above  may not be a complete list of recommended foods and beverages. Contact a dietitian for more options. What foods are not recommended? Fruits Fruit juice. Cooked, strained fruit. Vegetables Fried potatoes. Canned vegetables. Well-cooked vegetables. Grains White bread. Pasta made with refined flour. White rice. Meats and other proteins Fatty cuts of meat. Fried chicken or fried fish. Dairy Milk. Yogurt. Cream cheese. Sour cream. Fats and oils Butters. Beverages Soft drinks. Other foods Cakes and pastries. The items listed above may not be a complete list of foods and beverages to avoid. Contact a dietitian for more information. Summary  Fiber is a type of carbohydrate. It is found in fruits, vegetables, whole grains, and beans.  There are many health benefits of eating a high-fiber diet, such as preventing constipation, lowering blood cholesterol, helping with weight loss, and reducing your risk of heart disease, diabetes, and certain cancers.  Gradually increase your intake of fiber. Increasing too fast can result in cramping, bloating, and gas. Drink plenty of water while you increase your fiber.  The best sources of fiber include whole fruits and vegetables, whole grains, nuts, seeds, and beans. This information is not intended to replace advice given to you by your health care provider. Make sure you discuss any questions you have with your health care provider. Document Revised: 05/06/2017 Document Reviewed: 05/06/2017 Elsevier Patient Education  2020 [employer].

## 2019-11-06 NOTE — Progress Notes (Signed)
Pt discharged to home with husband. Discharge instructions and medication education provided to pt.

## 2019-11-06 NOTE — Discharge Summary (Addendum)
[facility] Discharge Summary   Patient ID: Monica Hendrix MRN: [id number] DOB/AGE: Dec 16, 1975 44 y.o.  Admit date: 10/29/2019 Discharge date: 11/06/2019  Discharge Diagnosis Patient Active Problem List   Diagnosis Date Noted  . Perforation of sigmoid colon due to diverticulitis 10/30/2019  . Family history of colon cancer in father 10/30/2019  . Acute pain of right knee 02/22/2018  . Sprain of right knee 02/22/2018  . Uterine leiomyoma 05/02/2017  . Acute posthemorrhagic anemia 04/16/2011  . Multigravida of advanced maternal age 61/04/2011    Consultants None  Imaging: CT ABDOMEN/PELVIS WITH CONTRAST 10/30/19 IMPRESSION: 1. Acute sigmoid diverticulitis. There is evidence of perforation, with pneumoperitoneum throughout the abdomen and pelvis. No fluid collection or abscess. 2. Trace pelvic free fluid. 3. Fibroid uterus.  Procedures None   HPI:  Pleasant woman who noted sudden severe abdominal pain.  Worsened.  Went to urgent care.  Exam concerning.  Recommendation made for CAT scan and transfer to [facility] main emergency department.  CT scan showing evidence of free air with probable sigmoid diverticulitis is the etiology.  Surgical consultation requested.  Patient's father had colon cancer.  She had a colonoscopy at age 37 that she recalls being underwhelming.  This was done by Dr. Cannon Kettle at [facility] gastroenterology.  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  She had C-sections and has history of uterine fibroid but no other abdominal issues.  She normally moves her bowels every day.  Can walk several miles without difficulty.  She does not know if she ever smoked tobacco.  She is not diabetic.  She occasionally has wine but no binge drinking.  No recent alcohol intake.   Hospital Course:  Patient was admitted and started on IV antibiotics for acute diverticulitis. As her pain improved her diet was slowly  advanced. On admission she had sinus tachycardia and her WBC was markedly elevated at 23.7. She completed 6-7 days of IV antibiotics before being transitioned to oral antibiotics. On 11/06/19 patients HR was WNL, she was afebrile, WBC trending down (12), abdominal pain almost completely resolved, tolerating PO, having non-bloody BMs, and felt stable for discharge home.  Patient will follow up in our office in 6-8 weeks and knows to call with questions or concerns.  She should also follow up with GI as below for a colonoscopy.   Physical Exam: General:  Alert, NAD, pleasant, comfortable Abd:  Soft, non-tender, nondistended, +BS, no HSM  Allergies as of 11/06/2019      Reactions   Macrobid [nitrofurantoin Macrocrystal] Hives   Hydrocodone Itching      Medication List    TAKE these medications   acetaminophen 325 MG tablet Commonly known as: TYLENOL Take 2 tablets (650 mg total) by mouth every 6 (six) hours as needed for mild pain (or temp > 100).   amoxicillin-clavulanate 875-125 MG tablet Commonly known as: AUGMENTIN Take 1 tablet by mouth every 12 (twelve) hours for 14 days.   b complex vitamins tablet Take 1 tablet by mouth daily.   cholecalciferol 25 MCG (1000 UNIT) tablet Commonly known as: VITAMIN D3 Take 1,000 Units by mouth 2 (two) times daily.   fluconazole 150 MG tablet Commonly known as: DIFLUCAN Take 1 tablet (150 mg total) by mouth once for 1 dose. Repeat if needed   multivitamin with minerals tablet Take 1 tablet by mouth daily.        Follow-up Information    Wilford Corner, MD. Schedule  an appointment as soon as possible for a visit in 6 week(s).   Specialty: Gastroenterology Why: for colonoscopy after recent admission for diverticulitis  Contact information: [address] [address] [phone]        Leighton Ruff, MD Follow up on 12/15/2019.   Specialty: General Surgery Why: at 10:30 AM to discuss elective surgery for  diverticulitis. please arrive by 10:00 AM to get checked in and fill out any necessary paperwork.  Contact information: [place] [address] [phone]           Signed: Obie Dredge, [facility] Surgery 11/06/2019, 10:25 AM

## 2019-11-09 ENCOUNTER — Encounter: Payer: Self-pay | Admitting: *Deleted

## 2019-11-09 ENCOUNTER — Other Ambulatory Visit: Payer: Self-pay | Admitting: *Deleted

## 2019-11-09 NOTE — Patient Outreach (Signed)
[facility]) Care Management  11/09/2019  Monica Hendrix May 22, 1976 [id number]   Transition of care call/case closure   Referral received:11/02/19 Initial outreach:11/09/19 Insurance: UMR    Subjective: Initial successful telephone call to patient's preferred number in order to complete transition of care assessment; 2 HIPAA identifiers verified. Explained purpose of call and completed transition of care assessment.  Monica Hendrix states that she is doing much better. She denies having any pain. She reports  tolerating low fiber diet only occassional cramp discomfort, she report that her bowels are becoming more formed, she denies noting blood in stools. She is aware of worsening symptoms to notify surgeon of and seek medical attention.   Spouse/children are assisting her recovery .  Monica Hendrix any ongoing health issues and says she does not need a referral to one of the [place] chronic disease management programs.  She is unsure if she has Hospital indemnity she plans to review benefits on web site and follow through with filing claim is she does . She uses a [facility] outpatient pharmacy at [facility].    Objective:  Monica Hendrix  was hospitalized at [facility] from 4/15-4/23/2021 for Perforation of sigmoid colon due to diverticulitis  Comorbidities include: Uterine Leiomyoma She was discharged to home on 4/23/21without the need for home health services or DME.   Assessment:  Patient voices good understanding of all discharge instructions.  See transition of care flowsheet for assessment details.   Plan:  Reviewed hospital discharge diagnosis of Perforated of sigmoid colon due to diverticulitis   and discharge treatment plan using hospital discharge instructions, assessing medication adherence, reviewing problems requiring provider notification, and discussing the importance of follow up with surgeon, primary care provider and/or  specialists as directed. Reviewed [place] healthy lifestyle program information to receive discounted premium for  2022   Step 1: Get  your annual physical  Step 2: Complete your health assessment between July 17, 2019 and March 16, 2020 at [email or web address]  Step 3:Identify your current health status and complete the corresponding action step between January 1, and March 16, 2020.     No ongoing care management needs identified so will close case to [facility] services and route successful outreach letter with [place] Management pamphlet and 24 Hour Nurse Line Magnet to [facility] clinical pool to be mailed to patient's home address.  Thanked patient for their services to [facility].   Joylene Draft, RN, BSN  [facility] Management Coordinator  [phone]- Mobile [phone]- Toll Free Main Office

## 2019-11-17 ENCOUNTER — Encounter: Payer: Self-pay | Admitting: Family Medicine

## 2019-11-18 ENCOUNTER — Other Ambulatory Visit: Payer: Self-pay

## 2019-11-18 DIAGNOSIS — K5720 Diverticulitis of large intestine with perforation and abscess without bleeding: Secondary | ICD-10-CM

## 2020-02-27 ENCOUNTER — Ambulatory Visit: Payer: Self-pay

## 2020-02-27 ENCOUNTER — Other Ambulatory Visit: Payer: Self-pay

## 2020-02-27 ENCOUNTER — Ambulatory Visit
Admission: EM | Admit: 2020-02-27 | Discharge: 2020-02-27 | Disposition: A | Discharge status: Home / Self Care | Payer: No Typology Code available for payment source | Attending: Emergency Medicine | Admitting: Emergency Medicine

## 2020-02-27 ENCOUNTER — Encounter: Payer: Self-pay | Admitting: *Deleted

## 2020-02-27 DIAGNOSIS — N3001 Acute cystitis with hematuria: Secondary | ICD-10-CM | POA: Diagnosis not present

## 2020-02-27 HISTORY — DX: Diverticulitis of intestine, part unspecified, without perforation or abscess without bleeding: K57.92

## 2020-02-27 LAB — POCT URINALYSIS DIP (MANUAL ENTRY)
Bilirubin, UA: NEGATIVE
Glucose, UA: NEGATIVE mg/dL
Ketones, POC UA: NEGATIVE mg/dL
Nitrite, UA: NEGATIVE
Protein Ur, POC: NEGATIVE mg/dL
Spec Grav, UA: >=1.03 — AB (ref 1.010–1.025)
Urobilinogen, UA: 0.2 E.U./dL
pH, UA: 6.5 (ref 5.0–8.0)

## 2020-02-27 MED ORDER — CEPHALEXIN 500 MG PO CAPS: 500.0000 mg | ORAL_CAPSULE | Freq: Two times a day (BID) | ORAL | 0 refills | Status: AC

## 2020-02-27 MED ORDER — FLUCONAZOLE 150 MG PO TABS: 150.0000 mg | ORAL_TABLET | Freq: Every day | ORAL | 0 refills | Status: DC

## 2020-02-27 NOTE — ED Triage Notes (Signed)
C/O dysuria, urinary urgency, bladder pressure, polyuria since yesterday.  Denies fevers or back pain.

## 2020-02-27 NOTE — ED Provider Notes (Signed)
[name]    CSN: [id number] Arrival date & time: 02/27/20  1449      History   Chief Complaint Chief Complaint  Patient presents with  . Appointment    1500  . Dysuria    HPI Monica Hendrix is a 44 y.o. female   Presenting for 2-day course of dysuria, urinary urgency, bladder pressure, urinary frequency.  Denies polyphagia, polydipsia, vaginal discharge, back pain, fever.  Has not tried thing for this.  Past Medical History:  Diagnosis Date  . Diverticulitis   . Fibroids   . Infection of cesarean section or perineal wound 04/24/2011    Patient Active Problem List   Diagnosis Date Noted  . Perforation of sigmoid colon due to diverticulitis 10/30/2019  . Family history of colon cancer in father 10/30/2019  . Acute pain of right knee 02/22/2018  . Sprain of right knee 02/22/2018  . Uterine leiomyoma 05/02/2017  . Acute posthemorrhagic anemia 04/16/2011  . Multigravida of advanced maternal age 33/04/2011    Past Surgical History:  Procedure Laterality Date  . CESAREAN SECTION    . CESAREAN SECTION  04/14/2011   Procedure: CESAREAN SECTION;  Surgeon: Frederico Hamman, MD;  Location: [facility];  Service: Gynecology;  Laterality: N/A;  Repeat of baby girl at 2300 APGAR 2/7    OB History    Gravida  3   Para  2   Term  1   Preterm  1   AB  1   Living  2     SAB  1   TAB  0   Ectopic  0   Multiple  0   Live Births  1            Home Medications    Prior to Admission medications   Medication Sig Start Date End Date Taking? Authorizing Provider  Ascorbic Acid (VITAMIN C PO) Take by mouth.   Yes [provider]  cholecalciferol (VITAMIN D3) 25 MCG (1000 UT) tablet Take 1,000 Units by mouth 2 (two) times daily.   Yes [provider]  Multiple Vitamins-Minerals (MULTIVITAMIN WITH MINERALS) tablet Take 1 tablet by mouth daily.   Yes [provider]  acetaminophen (TYLENOL) 325 MG tablet Take 2 tablets  (650 mg total) by mouth every 6 (six) hours as needed for mild pain (or temp > 100). 11/06/19   Jill Alexanders, PA-C  b complex vitamins tablet Take 1 tablet by mouth daily.    [provider]  cephALEXin (KEFLEX) 500 MG capsule Take 1 capsule (500 mg total) by mouth 2 (two) times daily for 5 days. 02/27/20 03/03/20  Hall-Potvin, [place], PA-C  QUEtiapine (SEROQUEL) 25 MG tablet Take 1-2 tablets (25-50 mg total) by mouth at bedtime. 01/05/19 05/08/19  Forrest Moron, MD    Family History Family History  Problem Relation Age of Onset  . Diabetes Mother   . Hypertension Mother   . Cancer Father   . Diabetes Father     Social History Social History   Tobacco Use  . Smoking status: Never Smoker  . Smokeless tobacco: Never Used  Vaping Use  . Vaping Use: Never used  Substance Use Topics  . Alcohol use: Yes    Comment: SOCIALLY  . Drug use: No     Allergies   Macrobid [nitrofurantoin macrocrystal] and Hydrocodone   Review of Systems As per HPI   Physical Exam Triage Vital Signs ED Triage Vitals  Enc Vitals Group  BP 02/27/20 1519 135/85     Pulse Rate 02/27/20 1519 (!) 108     Resp 02/27/20 1519 16     Temp 02/27/20 1519 98.3 F (36.8 C)     Temp Source 02/27/20 1519 Oral     SpO2 02/27/20 1519 98 %     Weight --      Height --      Head Circumference --      Peak Flow --      Pain Score 02/27/20 1520 7     Pain Loc --      Pain Edu? --      Excl. in [place]? --    No data found.  Updated Vital Signs BP 135/85   Pulse (!) 108   Temp 98.3 F (36.8 C) (Oral)   Resp 16   LMP 02/08/2020 (Approximate)   SpO2 98%   Visual Acuity Right Eye Distance:   Left Eye Distance:   Bilateral Distance:    Right Eye Near:   Left Eye Near:    Bilateral Near:     Physical Exam Constitutional:      General: She is not in acute distress. HENT:     Head: Normocephalic and atraumatic.  Eyes:     General: No scleral icterus.    Pupils: Pupils are equal,  round, and reactive to light.  Cardiovascular:     Rate and Rhythm: Normal rate.  Pulmonary:     Effort: Pulmonary effort is normal.  Abdominal:     General: Bowel sounds are normal.     Palpations: Abdomen is soft.     Tenderness: There is no abdominal tenderness. There is no right CVA tenderness, left CVA tenderness or guarding.  Skin:    Coloration: Skin is not jaundiced or pale.  Neurological:     Mental Status: She is alert and oriented to person, place, and time.      UC Treatments / Results  Labs (all labs ordered are listed, but only abnormal results are displayed) Labs Reviewed  POCT URINALYSIS DIP (MANUAL ENTRY) - Abnormal; Notable for the following components:      Result Value   Spec Grav, UA >=1.030 (*)    Blood, UA moderate (*)    Leukocytes, UA Small (1+) (*)    All other components within normal limits  URINE CULTURE    EKG   Radiology No results found.  Procedures Procedures (including critical care time)  Medications Ordered in UC Medications - No data to display  Initial Impression / Assessment and Plan / UC Course  I have reviewed the triage vital signs and the nursing notes.  Pertinent labs & imaging results that were available during my care of the patient were reviewed by me and considered in my medical decision making (see chart for details).     Urine with small leukocytes, blood.  Given patient's symptoms, will treat for UTI.  Culture pending: We will change antibiotics indicated.  Return precautions discussed, pt verbalized understanding and is agreeable to plan. Final Clinical Impressions(s) / UC Diagnoses   Final diagnoses:  Acute cystitis with hematuria   Discharge Instructions   None    ED Prescriptions    Medication Sig Dispense Auth. Provider   cephALEXin (KEFLEX) 500 MG capsule Take 1 capsule (500 mg total) by mouth 2 (two) times daily for 5 days. 10 capsule Hall-Potvin, [place], PA-C     PDMP not reviewed this  encounter.   Hall-Potvin, [place], [place] 02/27/20  1549  

## 2020-02-27 NOTE — Discharge Instructions (Signed)
Take antibiotic twice daily with food. Important to drink plenty of water throughout the day. Return for worsening urinary symptoms, blood in urine, abdominal or back pain, fever.

## 2020-03-02 LAB — URINE CULTURE: Culture: >=100000 — AB

## 2020-08-03 ENCOUNTER — Ambulatory Visit
Admission: RE | Admit: 2020-08-03 | Discharge: 2020-08-03 | Disposition: A | Discharge status: Home / Self Care | Payer: No Typology Code available for payment source | Source: Ambulatory Visit | Attending: Emergency Medicine | Admitting: Emergency Medicine

## 2020-08-03 ENCOUNTER — Other Ambulatory Visit: Payer: Self-pay

## 2020-08-03 VITALS — BP 154/82 | HR 101 | Temp 98.3°F | Resp 16

## 2020-08-03 DIAGNOSIS — N390 Urinary tract infection, site not specified: Secondary | ICD-10-CM | POA: Diagnosis not present

## 2020-08-03 LAB — POCT URINALYSIS DIP (MANUAL ENTRY)
Bilirubin, UA: NEGATIVE
Glucose, UA: NEGATIVE mg/dL
Ketones, POC UA: NEGATIVE mg/dL
Nitrite, UA: NEGATIVE
Protein Ur, POC: NEGATIVE mg/dL
Spec Grav, UA: <=1.005 — AB (ref 1.010–1.025)
Urobilinogen, UA: 0.2 E.U./dL
pH, UA: 6 (ref 5.0–8.0)

## 2020-08-03 MED ORDER — PHENAZOPYRIDINE HCL 200 MG PO TABS: 200.0000 mg | ORAL_TABLET | Freq: Three times a day (TID) | ORAL | 0 refills | Status: DC

## 2020-08-03 MED ORDER — CEPHALEXIN 500 MG PO CAPS: 500.0000 mg | ORAL_CAPSULE | Freq: Two times a day (BID) | ORAL | 0 refills | Status: AC

## 2020-08-03 NOTE — ED Provider Notes (Signed)
[name]    CSN: [id number] Arrival date & time: 08/03/20  1750      History   Chief Complaint Chief Complaint  Patient presents with  . Dysuria  . Appointment    1800    HPI Monica Hendrix is a 45 y.o. female presenting today for evaluation of possible UTI.  Patient reports that beginning today she has had dysuria, urinary frequency urgency and incomplete voiding.  Symptoms have progressed throughout the day.  Reports history of similar with UTIs in the past.  Denies vaginal symptoms of discharge itching or irritation.  Denies fevers nausea or vomiting.  Denies back pain.  HPI  Past Medical History:  Diagnosis Date  . Diverticulitis   . Fibroids   . Infection of cesarean section or perineal wound 04/24/2011    Patient Active Problem List   Diagnosis Date Noted  . Perforation of sigmoid colon due to diverticulitis 10/30/2019  . Family history of colon cancer in father 10/30/2019  . Acute pain of right knee 02/22/2018  . Sprain of right knee 02/22/2018  . Uterine leiomyoma 05/02/2017  . Acute posthemorrhagic anemia 04/16/2011  . Multigravida of advanced maternal age 78/04/2011    Past Surgical History:  Procedure Laterality Date  . CESAREAN SECTION    . CESAREAN SECTION  04/14/2011   Procedure: CESAREAN SECTION;  Surgeon: Frederico Hamman, MD;  Location: [facility];  Service: Gynecology;  Laterality: N/A;  Repeat of baby girl at 2300 APGAR 2/7    OB History    Gravida  3   Para  2   Term  1   Preterm  1   AB  1   Living  2     SAB  1   IAB  0   Ectopic  0   Multiple  0   Live Births  1            Home Medications    Prior to Admission medications   Medication Sig Start Date End Date Taking? Authorizing Provider  cephALEXin (KEFLEX) 500 MG capsule Take 1 capsule (500 mg total) by mouth 2 (two) times daily for 5 days. 08/03/20 08/08/20 Yes Hilmar Moldovan C, PA-C  phenazopyridine (PYRIDIUM) 200 MG tablet Take 1 tablet (200  mg total) by mouth 3 (three) times daily. 08/03/20  Yes Undine Nealis C, PA-C  acetaminophen (TYLENOL) 325 MG tablet Take 2 tablets (650 mg total) by mouth every 6 (six) hours as needed for mild pain (or temp > 100). 11/06/19   Simaan, Darci Current, PA-C  Ascorbic Acid (VITAMIN C PO) Take by mouth.    [provider]  b complex vitamins tablet Take 1 tablet by mouth daily.    [provider]  cholecalciferol (VITAMIN D3) 25 MCG (1000 UT) tablet Take 1,000 Units by mouth 2 (two) times daily.    [provider]  fluconazole (DIFLUCAN) 150 MG tablet Take 1 tablet (150 mg total) by mouth daily. May repeat in 72 hours if needed 02/27/20   Hall-Potvin, [place], PA-C  Multiple Vitamins-Minerals (MULTIVITAMIN WITH MINERALS) tablet Take 1 tablet by mouth daily.    [provider]  QUEtiapine (SEROQUEL) 25 MG tablet Take 1-2 tablets (25-50 mg total) by mouth at bedtime. 01/05/19 05/08/19  Forrest Moron, MD    Family History Family History  Problem Relation Age of Onset  . Diabetes Mother   . Hypertension Mother   . Cancer Father   . Diabetes Father  Social History Social History   Tobacco Use  . Smoking status: Never Smoker  . Smokeless tobacco: Never Used  Vaping Use  . Vaping Use: Never used  Substance Use Topics  . Alcohol use: Yes    Comment: SOCIALLY  . Drug use: No     Allergies   Macrobid [nitrofurantoin macrocrystal] and Hydrocodone   Review of Systems Review of Systems  Constitutional: Negative for fever.  Respiratory: Negative for shortness of breath.   Cardiovascular: Negative for chest pain.  Gastrointestinal: Negative for abdominal pain, diarrhea, nausea and vomiting.  Genitourinary: Positive for dysuria, frequency and urgency. Negative for flank pain, genital sores, hematuria, menstrual problem, vaginal bleeding, vaginal discharge and vaginal pain.  Musculoskeletal: Negative for back pain.  Skin: Negative for rash.   Neurological: Negative for dizziness, light-headedness and headaches.     Physical Exam Triage Vital Signs ED Triage Vitals  Enc Vitals Group     BP 08/03/20 1840 (!) 154/82     Pulse Rate 08/03/20 1840 (!) 101     Resp 08/03/20 1840 16     Temp 08/03/20 1840 98.3 F (36.8 C)     Temp Source 08/03/20 1840 Oral     SpO2 08/03/20 1840 98 %     Weight --      Height --      Head Circumference --      Peak Flow --      Pain Score 08/03/20 1847 4     Pain Loc --      Pain Edu? --      Excl. in [place]? --    No data found.  Updated Vital Signs BP (!) 154/82   Pulse (!) 101   Temp 98.3 F (36.8 C) (Oral)   Resp 16   SpO2 98%   Visual Acuity Right Eye Distance:   Left Eye Distance:   Bilateral Distance:    Right Eye Near:   Left Eye Near:    Bilateral Near:     Physical Exam Vitals and nursing note reviewed.  Constitutional:      Appearance: She is well-developed and well-nourished.     Comments: No acute distress  HENT:     Head: Normocephalic and atraumatic.     Nose: Nose normal.  Eyes:     Conjunctiva/sclera: Conjunctivae normal.  Cardiovascular:     Rate and Rhythm: Normal rate.  Pulmonary:     Effort: Pulmonary effort is normal. No respiratory distress.  Abdominal:     General: There is no distension.  Musculoskeletal:        General: Normal range of motion.     Cervical back: Neck supple.  Skin:    General: Skin is warm and dry.  Neurological:     Mental Status: She is alert and oriented to person, place, and time.  Psychiatric:        Mood and Affect: Mood and affect normal.      UC Treatments / Results  Labs (all labs ordered are listed, but only abnormal results are displayed) Labs Reviewed  POCT URINALYSIS DIP (MANUAL ENTRY) - Abnormal; Notable for the following components:      Result Value   Clarity, UA cloudy (*)    Spec Grav, UA <=1.005 (*)    Blood, UA small (*)    Leukocytes, UA Trace (*)    All other components within normal  limits  URINE CULTURE    EKG   Radiology No results found.  Procedures  Procedures (including critical care time)  Medications Ordered in UC Medications - No data to display  Initial Impression / Assessment and Plan / UC Course  I have reviewed the triage vital signs and the nursing notes.  Pertinent labs & imaging results that were available during my care of the patient were reviewed by me and considered in my medical decision making (see chart for details).     UA consistent with UTI, urine culture for confirmation and to check sensitivities.  Keflex twice daily x5 days.  Push fluids.  Monitor for resolution.  Discussed strict return precautions. Patient verbalized understanding and is agreeable with plan.  Final Clinical Impressions(s) / UC Diagnoses   Final diagnoses:  Lower urinary tract infection, acute     Discharge Instructions     Urine showed evidence of infection. We are treating you with keflex- twice daily for 5 days. Be sure to take full course. Stay hydrated- urine should be pale yellow to clear. Pyridium for discomfort as needed  Please return or follow up with your primary provider if symptoms not improving with treatment. Please return sooner if you have worsening of symptoms or develop fever, nausea, vomiting, abdominal pain, back pain, lightheadedness, dizziness.    ED Prescriptions    Medication Sig Dispense Auth. Provider   cephALEXin (KEFLEX) 500 MG capsule Take 1 capsule (500 mg total) by mouth 2 (two) times daily for 5 days. 10 capsule Franki Stemen C, PA-C   phenazopyridine (PYRIDIUM) 200 MG tablet Take 1 tablet (200 mg total) by mouth 3 (three) times daily. 6 tablet Shaylon Aden, [place] C, PA-C     PDMP not reviewed this encounter.   Marrie Chandra, [place] C, PA-C 08/04/20 1022

## 2020-08-03 NOTE — ED Triage Notes (Signed)
Pt here for dysuria starting this am; pt sts hx of same with UTI

## 2020-08-03 NOTE — Discharge Instructions (Signed)
Urine showed evidence of infection. We are treating you with keflex- twice daily for 5 days. Be sure to take full course. Stay hydrated- urine should be pale yellow to clear. Pyridium for discomfort as needed  Please return or follow up with your primary provider if symptoms not improving with treatment. Please return sooner if you have worsening of symptoms or develop fever, nausea, vomiting, abdominal pain, back pain, lightheadedness, dizziness.

## 2020-08-06 LAB — URINE CULTURE: Culture: <10000 — AB

## 2020-08-13 ENCOUNTER — Ambulatory Visit: Payer: Self-pay

## 2020-08-15 ENCOUNTER — Ambulatory Visit
Admission: RE | Admit: 2020-08-15 | Discharge: 2020-08-15 | Disposition: A | Discharge status: Home / Self Care | Payer: No Typology Code available for payment source | Source: Ambulatory Visit | Attending: Emergency Medicine | Admitting: Emergency Medicine

## 2020-08-15 ENCOUNTER — Other Ambulatory Visit: Payer: Self-pay

## 2020-08-15 VITALS — BP 139/81 | HR 93 | Temp 98.4°F | Resp 18

## 2020-08-15 DIAGNOSIS — R3915 Urgency of urination: Secondary | ICD-10-CM | POA: Insufficient documentation

## 2020-08-15 LAB — POCT URINALYSIS DIP (MANUAL ENTRY)
Bilirubin, UA: NEGATIVE
Blood, UA: NEGATIVE
Glucose, UA: NEGATIVE mg/dL
Ketones, POC UA: NEGATIVE mg/dL
Leukocytes, UA: NEGATIVE
Nitrite, UA: NEGATIVE
Protein Ur, POC: NEGATIVE mg/dL
Spec Grav, UA: >=1.03 — AB (ref 1.010–1.025)
Urobilinogen, UA: 0.2 E.U./dL
pH, UA: 6 (ref 5.0–8.0)

## 2020-08-15 MED ORDER — PHENAZOPYRIDINE HCL 200 MG PO TABS: 200.0000 mg | ORAL_TABLET | Freq: Three times a day (TID) | ORAL | 0 refills | Status: DC

## 2020-08-15 NOTE — Discharge Instructions (Signed)
Repeat urine culture, swab pending to screen for yeast and BV Use Pyridium in the meantime as needed May try over-the-counter naproxen for pain If results continue to be negative please follow-up with primary care or OB/GYN

## 2020-08-15 NOTE — ED Triage Notes (Signed)
Pt here for continued dysuria; pt was on antibiotics and told to stop due to no bacteria growth; pt sts worsening sx since stopping

## 2020-08-15 NOTE — ED Provider Notes (Signed)
[name]    CSN: [id number] Arrival date & time: 08/15/20  1848      History   Chief Complaint Chief Complaint  Patient presents with  . Appointment    1900  . Dysuria    HPI Monica Hendrix is a 45 y.o. female history of fibroids presenting today for evaluation of dysuria.  Patient was seen here on 1/16 and was started on antibiotics, symptoms slightly improved, but urine culture was negative and stopped antibiotics.  Over the past 1 to 2 days she has had worsening dysuria, abdominal pressure and urinary urgency.  She denies any vaginal discharge itching or irritation.  Has reported some slight urinary odor.  Denies fevers nausea or vomiting.  HPI  Past Medical History:  Diagnosis Date  . Diverticulitis   . Fibroids   . Infection of cesarean section or perineal wound 04/24/2011    Patient Active Problem List   Diagnosis Date Noted  . Perforation of sigmoid colon due to diverticulitis 10/30/2019  . Family history of colon cancer in father 10/30/2019  . Acute pain of right knee 02/22/2018  . Sprain of right knee 02/22/2018  . Uterine leiomyoma 05/02/2017  . Acute posthemorrhagic anemia 04/16/2011  . Multigravida of advanced maternal age 11/23/2010    Past Surgical History:  Procedure Laterality Date  . CESAREAN SECTION    . CESAREAN SECTION  04/14/2011   Procedure: CESAREAN SECTION;  Surgeon: Frederico Hamman, MD;  Location: [facility];  Service: Gynecology;  Laterality: N/A;  Repeat of baby girl at 2300 APGAR 2/7    OB History    Gravida  3   Para  2   Term  1   Preterm  1   AB  1   Living  2     SAB  1   IAB  0   Ectopic  0   Multiple  0   Live Births  1            Home Medications    Prior to Admission medications   Medication Sig Start Date End Date Taking? Authorizing Provider  phenazopyridine (PYRIDIUM) 200 MG tablet Take 1 tablet (200 mg total) by mouth 3 (three) times daily. 08/15/20  Yes Anabia Weatherwax C, PA-C   acetaminophen (TYLENOL) 325 MG tablet Take 2 tablets (650 mg total) by mouth every 6 (six) hours as needed for mild pain (or temp > 100). 11/06/19   Simaan, Darci Current, PA-C  Ascorbic Acid (VITAMIN C PO) Take by mouth.    [provider]  b complex vitamins tablet Take 1 tablet by mouth daily.    [provider]  cholecalciferol (VITAMIN D3) 25 MCG (1000 UT) tablet Take 1,000 Units by mouth 2 (two) times daily.    [provider]  Multiple Vitamins-Minerals (MULTIVITAMIN WITH MINERALS) tablet Take 1 tablet by mouth daily.    [provider]  QUEtiapine (SEROQUEL) 25 MG tablet Take 1-2 tablets (25-50 mg total) by mouth at bedtime. 01/05/19 05/08/19  Forrest Moron, MD    Family History Family History  Problem Relation Age of Onset  . Diabetes Mother   . Hypertension Mother   . Cancer Father   . Diabetes Father     Social History Social History   Tobacco Use  . Smoking status: Never Smoker  . Smokeless tobacco: Never Used  Vaping Use  . Vaping Use: Never used  Substance Use Topics  . Alcohol use: Yes  Comment: SOCIALLY  . Drug use: No     Allergies   Macrobid [nitrofurantoin macrocrystal] and Hydrocodone   Review of Systems Review of Systems  Constitutional: Negative for fever.  Respiratory: Negative for shortness of breath.   Cardiovascular: Negative for chest pain.  Gastrointestinal: Positive for abdominal pain. Negative for diarrhea, nausea and vomiting.  Genitourinary: Positive for dysuria, frequency and urgency. Negative for flank pain, genital sores, hematuria, menstrual problem, vaginal bleeding, vaginal discharge and vaginal pain.  Musculoskeletal: Negative for back pain.  Skin: Negative for rash.  Neurological: Negative for dizziness, light-headedness and headaches.     Physical Exam Triage Vital Signs ED Triage Vitals [08/15/20 1926]  Enc Vitals Group     BP 139/81     Pulse Rate 93     Resp 18     Temp 98.4 F  (36.9 C)     Temp Source Oral     SpO2 99 %     Weight      Height      Head Circumference      Peak Flow      Pain Score 0     Pain Loc      Pain Edu?      Excl. in [place]?    No data found.  Updated Vital Signs BP 139/81 (BP Location: Right Arm)   Pulse 93   Temp 98.4 F (36.9 C) (Oral)   Resp 18   SpO2 99%   Visual Acuity Right Eye Distance:   Left Eye Distance:   Bilateral Distance:    Right Eye Near:   Left Eye Near:    Bilateral Near:     Physical Exam Vitals and nursing note reviewed.  Constitutional:      Appearance: She is well-developed and well-nourished.     Comments: No acute distress  HENT:     Head: Normocephalic and atraumatic.     Nose: Nose normal.  Eyes:     Conjunctiva/sclera: Conjunctivae normal.  Cardiovascular:     Rate and Rhythm: Normal rate.  Pulmonary:     Effort: Pulmonary effort is normal. No respiratory distress.  Abdominal:     General: There is no distension.  Musculoskeletal:        General: Normal range of motion.     Cervical back: Neck supple.  Skin:    General: Skin is warm and dry.  Neurological:     Mental Status: She is alert and oriented to person, place, and time.  Psychiatric:        Mood and Affect: Mood and affect normal.      UC Treatments / Results  Labs (all labs ordered are listed, but only abnormal results are displayed) Labs Reviewed  POCT URINALYSIS DIP (MANUAL ENTRY) - Abnormal; Notable for the following components:      Result Value   Spec Grav, UA >=1.030 (*)    All other components within normal limits  URINE CULTURE  CERVICOVAGINAL ANCILLARY ONLY    EKG   Radiology No results found.  Procedures Procedures (including critical care time)  Medications Ordered in UC Medications - No data to display  Initial Impression / Assessment and Plan / UC Course  I have reviewed the triage vital signs and the nursing notes.  Pertinent labs & imaging results that were available during my care  of the patient were reviewed by me and considered in my medical decision making (see chart for details).     UA unremarkable today,  prior urine culture negative, deferring further antibiotic therapy will repeat culture, but discussed possible vaginal causes of symptoms, swab pending.  Using Pyridium in the interim to help with discomfort.  If swab negative and culture continue to be negative with persistent urinary symptoms recommending following up with primary care/OB/GYN or urology for further evaluation of symptoms.  Discussed strict return precautions. Patient verbalized understanding and is agreeable with plan.  Final Clinical Impressions(s) / UC Diagnoses   Final diagnoses:  Urinary urgency     Discharge Instructions     Repeat urine culture, swab pending to screen for yeast and BV Use Pyridium in the meantime as needed May try over-the-counter naproxen for pain If results continue to be negative please follow-up with primary care or OB/GYN    ED Prescriptions    Medication Sig Dispense Auth. Provider   phenazopyridine (PYRIDIUM) 200 MG tablet Take 1 tablet (200 mg total) by mouth 3 (three) times daily. 6 tablet Quinterious Walraven, [place] C, PA-C     PDMP not reviewed this encounter.   Janith Lima, [place] 08/16/20 [phone]

## 2020-08-16 ENCOUNTER — Ambulatory Visit (INDEPENDENT_AMBULATORY_CARE_PROVIDER_SITE_OTHER)
Admission: RE | Admit: 2020-08-16 | Discharge: 2020-08-16 | Disposition: A | Discharge status: Home / Self Care | Payer: No Typology Code available for payment source | Source: Ambulatory Visit | Attending: Internal Medicine | Admitting: Internal Medicine

## 2020-08-16 ENCOUNTER — Encounter: Payer: Self-pay | Admitting: Internal Medicine

## 2020-08-16 ENCOUNTER — Ambulatory Visit (INDEPENDENT_AMBULATORY_CARE_PROVIDER_SITE_OTHER): Payer: No Typology Code available for payment source | Admitting: Internal Medicine

## 2020-08-16 DIAGNOSIS — R1030 Lower abdominal pain, unspecified: Secondary | ICD-10-CM

## 2020-08-16 LAB — CBC WITH DIFFERENTIAL/PLATELET
Basophils Absolute: 0.1 10*3/uL (ref 0.0–0.1)
Basophils Relative: 0.6 % (ref 0.0–3.0)
Eosinophils Absolute: 0.1 10*3/uL (ref 0.0–0.7)
Eosinophils Relative: 0.7 % (ref 0.0–5.0)
HCT: 38.3 % (ref 36.0–46.0)
Hemoglobin: 13.1 g/dL (ref 12.0–15.0)
Lymphocytes Relative: 26.9 % (ref 12.0–46.0)
Lymphs Abs: 2.5 10*3/uL (ref 0.7–4.0)
MCHC: 34.3 g/dL (ref 30.0–36.0)
MCV: 98.4 fl (ref 78.0–100.0)
Monocytes Absolute: 0.5 10*3/uL (ref 0.1–1.0)
Monocytes Relative: 5.3 % (ref 3.0–12.0)
Neutro Abs: 6.2 10*3/uL (ref 1.4–7.7)
Neutrophils Relative %: 66.5 % (ref 43.0–77.0)
Platelets: 314 10*3/uL (ref 150.0–400.0)
RBC: 3.89 Mil/uL (ref 3.87–5.11)
RDW: 12.6 % (ref 11.5–15.5)
WBC: 9.3 10*3/uL (ref 4.0–10.5)

## 2020-08-16 LAB — COMPREHENSIVE METABOLIC PANEL
ALT: 10 U/L (ref 0–35)
AST: 16 U/L (ref 0–37)
Albumin: 4.2 g/dL (ref 3.5–5.2)
Alkaline Phosphatase: 58 U/L (ref 39–117)
BUN: 11 mg/dL (ref 6–23)
CO2: 29 mEq/L (ref 19–32)
Calcium: 9.1 mg/dL (ref 8.4–10.5)
Chloride: 103 mEq/L (ref 96–112)
Creatinine, Ser: 0.67 mg/dL (ref 0.40–1.20)
GFR: 105.66 mL/min (ref >60.00)
Glucose, Bld: 90 mg/dL (ref 70–99)
Potassium: 4.3 mEq/L (ref 3.5–5.1)
Sodium: 135 mEq/L (ref 135–145)
Total Bilirubin: 0.7 mg/dL (ref 0.2–1.2)
Total Protein: 7.5 g/dL (ref 6.0–8.3)

## 2020-08-16 MED ORDER — IOHEXOL 300 MG/ML  SOLN
100.0000 mL | Freq: Once | INTRAMUSCULAR | Status: AC | PRN
  Administered 2020-08-16: 100 mL via INTRAVENOUS

## 2020-08-16 NOTE — Progress Notes (Signed)
Subjective:    Patient ID: Monica Hendrix, female    DOB: Oct 29, 1975, 45 y.o.   MRN: [id number]  HPI The patient is here for an acute visit.  She has a history of diverticulitis-less than 1 year ago.  She was hospitalized at that time due to microperforation.  Last week she noticed a change in her bowels.  She had changed her diet so thought it could be related to that.  She also has been experiencing some intermittent pressure in her central lower abdomen.  She thought she may have had a urinary tract infection and she did go to urgent care.  Her initial urine showed a little bit of blood and possible infection and she was initially started on antibiotics on 1/19.  The culture returned without significant growth and she was advised to stop antibiotic.  She continued to experience the pressure intermittently in the lower abdomen.  She also had soft-diarrhea intermittently.  She did go to urgent care yesterday for some urinary urgency.  Urinalysis at that time was negative.  Cervical culture did not reveal BV.  She was prescribed some Pyridium and she did take them today she does not feel great she is not sure if it is related to medication or what ever is going on.  She denies any fevers or chills.  She has not had any nausea.  She denies a history of kidney stones.  She has had a dull ache in her right lower back that radiated radiates around to the front.  She has known multiple fibroids.  She does have heavier periods at times.  She is expected to start her menses this Friday.   Reviewed notes and results from urgent care visits  Medications and allergies reviewed with patient and updated if appropriate.  Patient Active Problem List   Diagnosis Date Noted  . Perforation of sigmoid colon due to diverticulitis 10/30/2019  . Family history of colon cancer in father 10/30/2019  . Acute pain of right knee 02/22/2018  . Sprain of right knee 02/22/2018  . Uterine leiomyoma 05/02/2017   . Acute posthemorrhagic anemia 04/16/2011  . Multigravida of advanced maternal age 70/04/2011    Current Outpatient Medications on File Prior to Visit  Medication Sig Dispense Refill  . acetaminophen (TYLENOL) 325 MG tablet Take 2 tablets (650 mg total) by mouth every 6 (six) hours as needed for mild pain (or temp > 100).    . Ascorbic Acid (VITAMIN C PO) Take by mouth.    Marland Kitchen b complex vitamins tablet Take 1 tablet by mouth daily.    . cholecalciferol (VITAMIN D3) 25 MCG (1000 UT) tablet Take 1,000 Units by mouth 2 (two) times daily.    . Multiple Vitamins-Minerals (MULTIVITAMIN WITH MINERALS) tablet Take 1 tablet by mouth daily.    . phenazopyridine (PYRIDIUM) 200 MG tablet Take 1 tablet (200 mg total) by mouth 3 (three) times daily. 6 tablet 0  . [DISCONTINUED] QUEtiapine (SEROQUEL) 25 MG tablet Take 1-2 tablets (25-50 mg total) by mouth at bedtime. 60 tablet 0   No current facility-administered medications on file prior to visit.    Past Medical History:  Diagnosis Date  . Diverticulitis   . Fibroids   . Infection of cesarean section or perineal wound 04/24/2011    Past Surgical History:  Procedure Laterality Date  . CESAREAN SECTION    . CESAREAN SECTION  04/14/2011   Procedure: CESAREAN SECTION;  Surgeon: Frederico Hamman, MD;  Location: [facility];  Service: Gynecology;  Laterality: N/A;  Repeat of baby girl at 9 APGAR 2/7    Social History   Socioeconomic History  . Marital status: Married    Spouse name: Not on file  . Number of children: 2  . Years of education: Not on file  . Highest education level: Not on file  Occupational History  . Not on file  Tobacco Use  . Smoking status: Never Smoker  . Smokeless tobacco: Never Used  Vaping Use  . Vaping Use: Never used  Substance and Sexual Activity  . Alcohol use: Yes    Comment: SOCIALLY  . Drug use: No  . Sexual activity: Yes    Birth control/protection: None  Other Topics Concern  . Not on file  Social  History Narrative  . Not on file   Social Determinants of Health   Financial Resource Strain: Not on file  Food Insecurity: Not on file  Transportation Needs: Not on file  Physical Activity: Not on file  Stress: Not on file  Social Connections: Not on file    Family History  Problem Relation Age of Onset  . Diabetes Mother   . Hypertension Mother   . Cancer Father   . Diabetes Father     Review of Systems  Constitutional: Negative for fever.  Gastrointestinal: Positive for abdominal pain. Negative for nausea.       Soft-diarrhea intermittent  Genitourinary: Positive for urgency. Negative for dysuria and hematuria.       Bladder pressure       Objective:   Vitals:   08/16/20 1117  BP: 138/78  Pulse: 90  Temp: 98 F (36.7 C)  SpO2: 97%   BP Readings from Last 3 Encounters:  08/16/20 138/78  08/15/20 139/81  08/03/20 (!) 154/82   Wt Readings from Last 3 Encounters:  08/16/20 158 lb (71.7 kg)  10/29/19 160 lb (72.6 kg)  06/02/19 164 lb 6.4 oz (74.6 kg)   Body mass index is 28.9 kg/m.   Physical Exam Constitutional:      General: She is not in acute distress.    Appearance: Normal appearance. She is not ill-appearing.  HENT:     Head: Normocephalic and atraumatic.  Abdominal:     General: Abdomen is flat. There is no distension.     Palpations: Abdomen is soft. There is no mass.     Tenderness: There is abdominal tenderness (Lower abdomen-concentrated in suprapubic region). There is no right CVA tenderness, left CVA tenderness, guarding or rebound.  Musculoskeletal:     Right lower leg: No edema.     Left lower leg: No edema.  Skin:    General: Skin is warm and dry.  Neurological:     Mental Status: She is alert.        CT ABDOMEN PELVIS W CONTRAST CLINICAL DATA:  Sudden onset lower abdominal pain for 2 hours  EXAM: CT ABDOMEN AND PELVIS WITH CONTRAST  TECHNIQUE: Multidetector CT imaging of the abdomen and pelvis was performed using the  standard protocol following bolus administration of intravenous contrast.  CONTRAST:  141mL OMNIPAQUE IOHEXOL 300 MG/ML  SOLN  COMPARISON:  None.  FINDINGS: Lower chest: No acute pleural or parenchymal lung disease.  Hepatobiliary: No focal liver abnormality is seen. No gallstones, gallbladder wall thickening, or biliary dilatation.  Pancreas: Unremarkable. No pancreatic ductal dilatation or surrounding inflammatory changes.  Spleen: Normal in size without focal abnormality.  Adrenals/Urinary Tract: Adrenal glands are unremarkable. Kidneys are normal,  without renal calculi, focal lesion, or hydronephrosis. Bladder is unremarkable.  Stomach/Bowel: There is abnormal wall thickening of the mid sigmoid colon, with surrounding pericolonic fat stranding. Findings are consistent with acute diverticulitis. There is free gas within the peritoneal cavity consistent with perforated diverticulitis. No fluid collection or abscess.  No bowel obstruction or ileus. Normal appendix right lower quadrant.  Vascular/Lymphatic: No significant vascular findings are present. No enlarged abdominal or pelvic lymph nodes.  Reproductive: Uterus is enlarged, with multiple uterine fibroids. No adnexal masses.  Other: There is a small amount of free fluid within the cul-de-sac. No fluid collection or abscess. Pneumoperitoneum is seen throughout the abdomen.  Musculoskeletal: No acute or destructive bony lesions. Reconstructed images demonstrate no additional findings.  IMPRESSION: 1. Acute sigmoid diverticulitis. There is evidence of perforation, with pneumoperitoneum throughout the abdomen and pelvis. No fluid collection or abscess. 2. Trace pelvic free fluid. 3. Fibroid uterus.  These results were called by telephone at the time of interpretation on 10/30/2019 at 12:38 am to provider [facility] , who verbally acknowledged these results.  Electronically Signed   By: Randa Ngo M.D.    On: 10/30/2019 00:39      Assessment & Plan:    Lower abdominal pain: Acute Has had some pressure in her lower abdomen that has been somewhat intermittent since last week Has had some mild urine symptoms and changes in stool UTI ruled out urgent care History of diverticulitis which is a possibility No history of kidney stones, but initial urinalysis did have a small amount of blood-that seems less likely, but possible Has multiple fibroids in her uterus and she is expecting her menses in a few days and this could be causing some of her discomfort Check CBC, CMP Given the possibility of diverticulitis may need to do imaging-CT of the abdomen and pelvis ordered and will be done today or tomorrow at the latest     This visit occurred during the SARS-CoV-2 public health emergency.  Safety protocols were in place, including screening questions prior to the visit, additional usage of staff PPE, and extensive cleaning of exam room while observing appropriate contact time as indicated for disinfecting solutions.

## 2020-08-16 NOTE — Patient Instructions (Signed)
  Blood work was ordered.     A CT scan was ordered.

## 2020-08-17 LAB — CERVICOVAGINAL ANCILLARY ONLY
Bacterial Vaginitis (gardnerella): NEGATIVE
Candida Glabrata: NEGATIVE
Candida Vaginitis: NEGATIVE
Chlamydia: NEGATIVE
Comment: NEGATIVE
Comment: NEGATIVE
Comment: NEGATIVE
Comment: NEGATIVE
Comment: NEGATIVE
Comment: NORMAL
Neisseria Gonorrhea: NEGATIVE
Trichomonas: NEGATIVE

## 2020-08-17 LAB — URINE CULTURE: Culture: NO GROWTH

## 2020-08-19 LAB — HM COLONOSCOPY

## 2020-08-26 ENCOUNTER — Other Ambulatory Visit (HOSPITAL_COMMUNITY): Payer: Self-pay | Admitting: Gastroenterology

## 2020-08-26 MED FILL — PEG-3350 SOLUTION: 420 | 1 days supply | Qty: 4000 | Fill #0

## 2020-09-15 ENCOUNTER — Ambulatory Visit (INDEPENDENT_AMBULATORY_CARE_PROVIDER_SITE_OTHER): Payer: No Typology Code available for payment source | Admitting: Internal Medicine

## 2020-09-15 ENCOUNTER — Encounter: Payer: Self-pay | Admitting: Internal Medicine

## 2020-09-15 VITALS — BP 122/76 | HR 91 | Temp 98.6°F | Ht 62.0 in | Wt 158.0 lb

## 2020-09-15 DIAGNOSIS — Z23 Encounter for immunization: Secondary | ICD-10-CM

## 2020-09-15 DIAGNOSIS — Z0000 Encounter for general adult medical examination without abnormal findings: Secondary | ICD-10-CM | POA: Diagnosis not present

## 2020-09-15 DIAGNOSIS — Z1231 Encounter for screening mammogram for malignant neoplasm of breast: Secondary | ICD-10-CM

## 2020-09-15 NOTE — Patient Instructions (Signed)
Health Maintenance, Female Adopting a healthy lifestyle and getting preventive care are important in promoting health and wellness. Ask your health care provider about:  The right schedule for you to have regular tests and exams.  Things you can do on your own to prevent diseases and keep yourself healthy. What should I know about diet, weight, and exercise? Eat a healthy diet  Eat a diet that includes plenty of vegetables, fruits, low-fat dairy products, and lean protein.  Do not eat a lot of foods that are high in solid fats, added sugars, or sodium.   Maintain a healthy weight Body mass index (BMI) is used to identify weight problems. It estimates body fat based on height and weight. Your health care provider can help determine your BMI and help you achieve or maintain a healthy weight. Get regular exercise Get regular exercise. This is one of the most important things you can do for your health. Most adults should:  Exercise for at least 150 minutes each week. The exercise should increase your heart rate and make you sweat (moderate-intensity exercise).  Do strengthening exercises at least twice a week. This is in addition to the moderate-intensity exercise.  Spend less time sitting. Even light physical activity can be beneficial. Watch cholesterol and blood lipids Have your blood tested for lipids and cholesterol at 46 years of age, then have this test every 5 years. Have your cholesterol levels checked more often if:  Your lipid or cholesterol levels are high.  You are older than 46 years of age.  You are at high risk for heart disease. What should I know about cancer screening? Depending on your health history and family history, you may need to have cancer screening at various ages. This may include screening for:  Breast cancer.  Cervical cancer.  Colorectal cancer.  Skin cancer.  Lung cancer. What should I know about heart disease, diabetes, and high blood  pressure? Blood pressure and heart disease  High blood pressure causes heart disease and increases the risk of stroke. This is more likely to develop in people who have high blood pressure readings, are of African descent, or are overweight.  Have your blood pressure checked: ? Every 3-5 years if you are 18-39 years of age. ? Every year if you are 40 years old or older. Diabetes Have regular diabetes screenings. This checks your fasting blood sugar level. Have the screening done:  Once every three years after age 40 if you are at a normal weight and have a low risk for diabetes.  More often and at a younger age if you are overweight or have a high risk for diabetes. What should I know about preventing infection? Hepatitis B If you have a higher risk for hepatitis B, you should be screened for this virus. Talk with your health care provider to find out if you are at risk for hepatitis B infection. Hepatitis C Testing is recommended for:  Everyone born from 1945 through 1965.  Anyone with known risk factors for hepatitis C. Sexually transmitted infections (STIs)  Get screened for STIs, including gonorrhea and chlamydia, if: ? You are sexually active and are younger than 46 years of age. ? You are older than 46 years of age and your health care provider tells you that you are at risk for this type of infection. ? Your sexual activity has changed since you were last screened, and you are at increased risk for chlamydia or gonorrhea. Ask your health care provider   if you are at risk.  Ask your health care provider about whether you are at high risk for HIV. Your health care provider may recommend a prescription medicine to help prevent HIV infection. If you choose to take medicine to prevent HIV, you should first get tested for HIV. You should then be tested every 3 months for as long as you are taking the medicine. Pregnancy  If you are about to stop having your period (premenopausal) and  you may become pregnant, seek counseling before you get pregnant.  Take 400 to 800 micrograms (mcg) of folic acid every day if you become pregnant.  Ask for birth control (contraception) if you want to prevent pregnancy. Osteoporosis and menopause Osteoporosis is a disease in which the bones lose minerals and strength with aging. This can result in bone fractures. If you are 65 years old or older, or if you are at risk for osteoporosis and fractures, ask your health care provider if you should:  Be screened for bone loss.  Take a calcium or vitamin D supplement to lower your risk of fractures.  Be given hormone replacement therapy (HRT) to treat symptoms of menopause. Follow these instructions at home: Lifestyle  Do not use any products that contain nicotine or tobacco, such as cigarettes, e-cigarettes, and chewing tobacco. If you need help quitting, ask your health care provider.  Do not use street drugs.  Do not share needles.  Ask your health care provider for help if you need support or information about quitting drugs. Alcohol use  Do not drink alcohol if: ? Your health care provider tells you not to drink. ? You are pregnant, may be pregnant, or are planning to become pregnant.  If you drink alcohol: ? Limit how much you use to 0-1 drink a day. ? Limit intake if you are breastfeeding.  Be aware of how much alcohol is in your drink. In the [place] one drink equals one 12 oz bottle of beer (355 mL), one 5 oz glass of wine (148 mL), or one 1 oz glass of hard liquor (44 mL). General instructions  Schedule regular health, dental, and eye exams.  Stay current with your vaccines.  Tell your health care provider if: ? You often feel depressed. ? You have ever been abused or do not feel safe at home. Summary  Adopting a healthy lifestyle and getting preventive care are important in promoting health and wellness.  Follow your health care provider's instructions about healthy  diet, exercising, and getting tested or screened for diseases.  Follow your health care provider's instructions on monitoring your cholesterol and blood pressure. This information is not intended to replace advice given to you by your health care provider. Make sure you discuss any questions you have with your health care provider. Document Revised: 06/25/2018 Document Reviewed: 06/25/2018 Elsevier Patient Education  2021 [employer]  

## 2020-09-15 NOTE — Progress Notes (Signed)
Subjective:  Patient ID: Monica Hendrix, female    DOB: [birth year]  Age: 45 y.o. MRN: [id number]  CC: Annual Exam  This visit occurred during the SARS-CoV-2 public health emergency.  Safety protocols were in place, including screening questions prior to the visit, additional usage of staff PPE, and extensive cleaning of exam room while observing appropriate contact time as indicated for disinfecting solutions.    HPI [name] presents for a CPX and to establish.  She feels well. Offers no complaints.  Outpatient Medications Prior to Visit  Medication Sig Dispense Refill  . acetaminophen (TYLENOL) 325 MG tablet Take 2 tablets (650 mg total) by mouth every 6 (six) hours as needed for mild pain (or temp > 100).    . Ascorbic Acid (VITAMIN C PO) Take by mouth.    Marland Kitchen b complex vitamins tablet Take 1 tablet by mouth daily.    . cholecalciferol (VITAMIN D3) 25 MCG (1000 UT) tablet Take 1,000 Units by mouth 2 (two) times daily.    . Multiple Vitamins-Minerals (MULTIVITAMIN WITH MINERALS) tablet Take 1 tablet by mouth daily.    . phenazopyridine (PYRIDIUM) 200 MG tablet Take 1 tablet (200 mg total) by mouth 3 (three) times daily. 6 tablet 0   No facility-administered medications prior to visit.    ROS Review of Systems  Constitutional: Negative for appetite change, diaphoresis, fatigue and unexpected weight change.  HENT: Negative.   Eyes: Negative.   Respiratory: Negative.  Negative for cough.   Cardiovascular: Negative.  Negative for chest pain.  Gastrointestinal: Negative for abdominal pain, constipation and diarrhea.  Endocrine: Negative.   Genitourinary: Negative.   Musculoskeletal: Negative.  Negative for arthralgias and myalgias.  Skin: Negative.   Neurological: Negative.  Negative for dizziness, tremors and weakness.  Hematological: Negative for adenopathy. Does not bruise/bleed easily.  Psychiatric/Behavioral: Negative.     Objective:  BP 122/76   Pulse  91   Temp 98.6 F (37 C) (Oral)   Ht 5\' 2"  (1.575 m)   Wt 158 lb (71.7 kg)   SpO2 99%   BMI 28.90 kg/m   BP Readings from Last 3 Encounters:  09/15/20 122/76  08/16/20 138/78  08/15/20 139/81    Wt Readings from Last 3 Encounters:  09/15/20 158 lb (71.7 kg)  08/16/20 158 lb (71.7 kg)  10/29/19 160 lb (72.6 kg)    Physical Exam Vitals reviewed.  Constitutional:      Appearance: Normal appearance.  HENT:     Nose: Nose normal.     Mouth/Throat:     Mouth: Mucous membranes are moist.  Eyes:     General: No scleral icterus.    Conjunctiva/sclera: Conjunctivae normal.  Cardiovascular:     Rate and Rhythm: Normal rate and regular rhythm.     Heart sounds: No murmur heard. No friction rub.  Pulmonary:     Effort: Pulmonary effort is normal.     Breath sounds: No stridor. No wheezing, rhonchi or rales.  Abdominal:     General: Abdomen is flat. Bowel sounds are normal. There is no distension.     Palpations: Abdomen is soft. There is no hepatomegaly, splenomegaly or mass.     Tenderness: There is no abdominal tenderness.  Musculoskeletal:        General: Normal range of motion.     Cervical back: Neck supple.     Right lower leg: No edema.     Left lower leg: No edema.  Lymphadenopathy:  Cervical: No cervical adenopathy.  Skin:    General: Skin is warm and dry.     Coloration: Skin is not pale.  Neurological:     General: No focal deficit present.     Mental Status: She is alert and oriented to person, place, and time. Mental status is at baseline.  Psychiatric:        Mood and Affect: Mood normal.        Behavior: Behavior normal.     Lab Results  Component Value Date   WBC 9.3 08/16/2020   HGB 13.1 08/16/2020   HCT 38.3 08/16/2020   PLT 314.0 08/16/2020   GLUCOSE 90 08/16/2020   CHOL 180 09/16/2020   TRIG 26.0 09/16/2020   HDL 73.50 09/16/2020   LDLCALC 102 (H) 09/16/2020   ALT 10 08/16/2020   AST 16 08/16/2020   NA 135 08/16/2020   K 4.3  08/16/2020   CL 103 08/16/2020   CREATININE 0.67 08/16/2020   BUN 11 08/16/2020   CO2 29 08/16/2020   TSH 1.070 01/01/2019   HGBA1C 5.4 10/24/2017    CT Abdomen Pelvis W Contrast  Result Date: 08/16/2020 CLINICAL DATA:  Right flank pain. History of diverticulitis with perforated colon last April. Abdominal discomfort and change in bowels last week. Diverticulitis suspected. EXAM: CT ABDOMEN AND PELVIS WITH CONTRAST TECHNIQUE: Multidetector CT imaging of the abdomen and pelvis was performed using the standard protocol following bolus administration of intravenous contrast. CONTRAST:  13mL OMNIPAQUE IOHEXOL 300 MG/ML  SOLN COMPARISON:  10/30/2019 FINDINGS: Lower chest: Clear lung bases. Normal heart size without pericardial or pleural effusion. Hepatobiliary: Scattered subcentimeter right hepatic lobe cysts. Normal gallbladder, without biliary ductal dilatation. Pancreas: Favor interdigitation of peripancreatic fat within the pancreatic head at 2 mm on 23/2. No duct dilatation or acute inflammation. Spleen: Normal in size, without focal abnormality. Adrenals/Urinary Tract: Normal adrenal glands. Normal kidneys, without hydronephrosis. Normal urinary bladder. Stomach/Bowel: Normal stomach, without wall thickening. Extensive colonic diverticulosis. No residual or recurrent diverticulitis identified. Normal terminal ileum. Appendix not visualized. Normal small bowel. Vascular/Lymphatic: Normal caliber of the aorta and branch vessels. No abdominopelvic adenopathy. Reproductive: Uterine fibroids, including a fundal 2.7 cm hypoattenuating lesion. No adnexal mass. Other: No significant free fluid.  No free intraperitoneal air. Musculoskeletal: No acute osseous abnormality. 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 IMPRESSION: 1. No acute process in the abdomen or pelvis. No explanation for right-sided pain. 2. Extensive colonic diverticulosis without residual or recurrent diverticulitis. 3. Uterine  fibroids. Electronically Signed   By: Abigail Miyamoto M.D.   On: 08/16/2020 16:25    Assessment & Plan:   [name] was seen today for annual exam.  Diagnoses and all orders for this visit:  Routine general medical examination at a health care facility- Exam completed, labs reviewed - statin tx is not indicated, cancer screenings and vaccines addressed, pt ed material was given. -     Lipid panel; Future -     Hepatitis C antibody; Future  Visit for screening mammogram -     MM DIGITAL SCREENING BILATERAL; Future  Other orders -     Tdap vaccine greater than or equal to 7yo IM   I have discontinued Taeja J. Noack's phenazopyridine. I am also having her maintain her multivitamin with minerals, cholecalciferol, b complex vitamins, acetaminophen, and Ascorbic Acid (VITAMIN C PO).  No orders of the defined types were placed in this encounter.    Follow-up: Return if symptoms worsen or fail to improve.  Scarlette Calico, MD

## 2020-09-16 ENCOUNTER — Other Ambulatory Visit: Payer: Self-pay

## 2020-09-16 ENCOUNTER — Other Ambulatory Visit (INDEPENDENT_AMBULATORY_CARE_PROVIDER_SITE_OTHER): Payer: No Typology Code available for payment source

## 2020-09-16 DIAGNOSIS — Z0000 Encounter for general adult medical examination without abnormal findings: Secondary | ICD-10-CM | POA: Diagnosis not present

## 2020-09-16 LAB — LIPID PANEL
Cholesterol: 180 mg/dL (ref 0–200)
HDL: 73.5 mg/dL (ref >39.00)
LDL Cholesterol: 102 mg/dL — ABNORMAL HIGH (ref 0–99)
NonHDL: 106.79
Total CHOL/HDL Ratio: 2
Triglycerides: 26 mg/dL (ref 0.0–149.0)
VLDL: 5.2 mg/dL (ref 0.0–40.0)

## 2020-09-19 LAB — HEPATITIS C ANTIBODY
Hepatitis C Ab: NONREACTIVE
SIGNAL TO CUT-OFF: 0.02 (ref <1.00)

## 2021-03-21 ENCOUNTER — Telehealth: Payer: No Typology Code available for payment source | Admitting: Physician Assistant

## 2021-03-21 DIAGNOSIS — N898 Other specified noninflammatory disorders of vagina: Secondary | ICD-10-CM

## 2021-03-21 MED ORDER — FLUCONAZOLE 150 MG PO TABS
150.0000 mg | ORAL_TABLET | Freq: Once | ORAL | 0 refills | Status: AC
  Filled 2021-03-21: qty 1, 1d supply, fill #0

## 2021-03-21 NOTE — Progress Notes (Signed)
I have spent 5 minutes in review of e-visit questionnaire, review and updating patient chart, medical decision making and response to patient.   Reign Bartnick Cody Sirus Labrie, PA-C    

## 2021-03-21 NOTE — Progress Notes (Signed)

## 2021-03-22 ENCOUNTER — Other Ambulatory Visit (HOSPITAL_COMMUNITY): Payer: Self-pay

## 2021-04-18 ENCOUNTER — Other Ambulatory Visit (HOSPITAL_COMMUNITY): Payer: Self-pay

## 2021-07-19 IMAGING — CT CT ABD-PELV W/ CM
2 of 5 series · 15 of 46 positions shown, 17 images · IV contrast (OMNIPAQUE 300)
Comparison: 10/30/2019

CLINICAL DATA: Right flank pain. History of diverticulitis with
perforated Kwong Yeung Maisie. Abdominal discomfort and change in
bowels last week. Diverticulitis suspected.

EXAM:
CT ABDOMEN AND PELVIS WITH CONTRAST
TECHNIQUE: Multidetector CT imaging of the abdomen and pelvis was performed
using the standard protocol following bolus administration of
intravenous contrast.
CONTRAST:  100mL OMNIPAQUE IOHEXOL 300 MG/ML  SOLN

[Series 2: abd/pel w · axial · 0.64mm/px · z∈[-487,-87]mm · 12 of 90 slices shown, 14 images]
[im 5/90  soft-tissue]
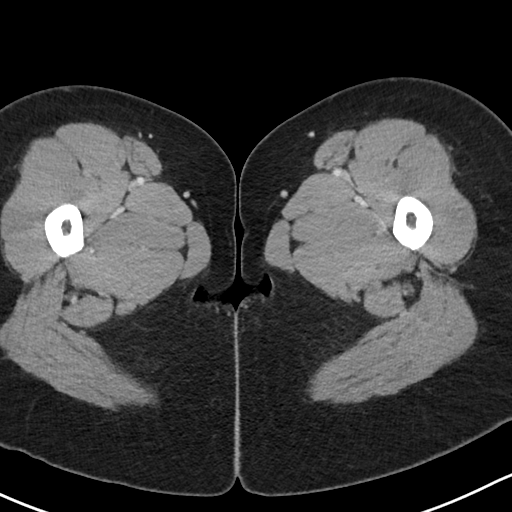
[im 5/90  bone]
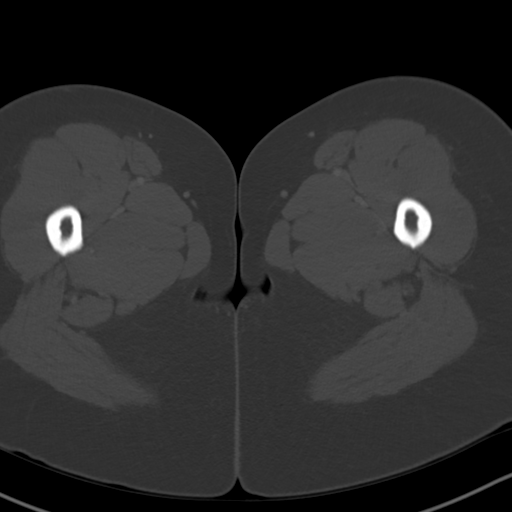
[im 14/90  soft-tissue]
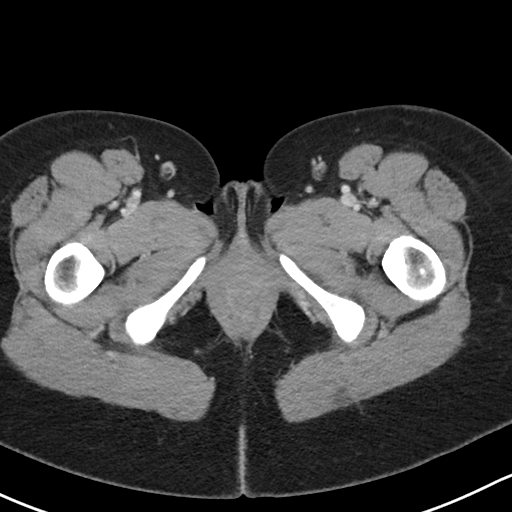
[im 18/90  soft-tissue]
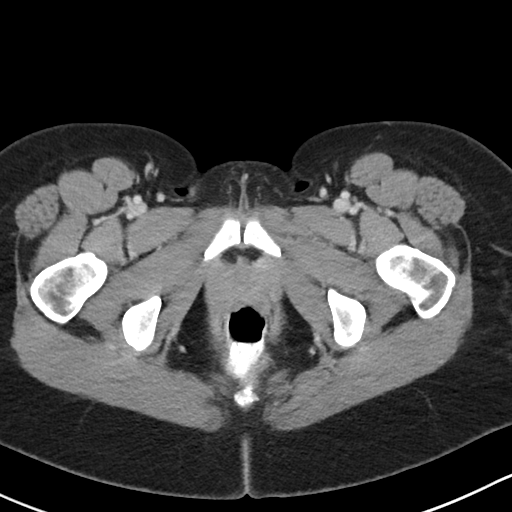
[im 27/90  soft-tissue]
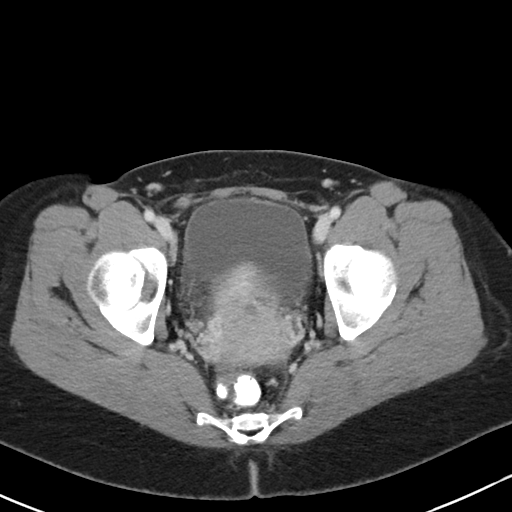
[im 36/90  soft-tissue]
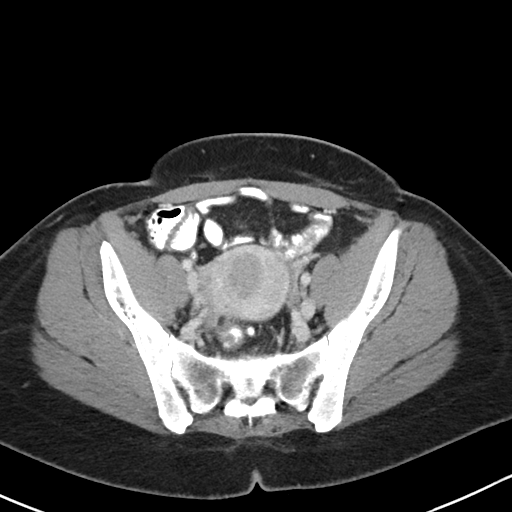
[im 41/90  soft-tissue]
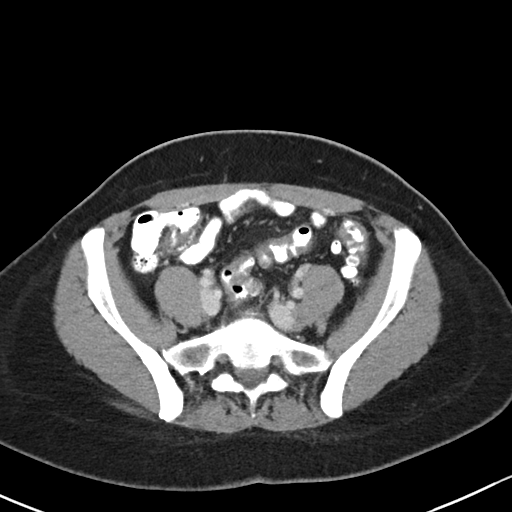
[im 49/90  soft-tissue]
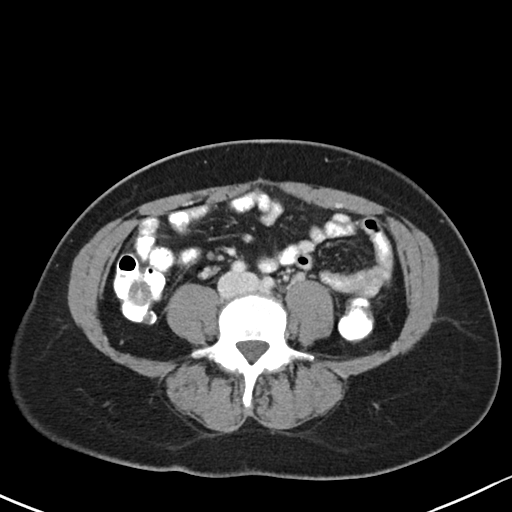
[im 54/90  soft-tissue]
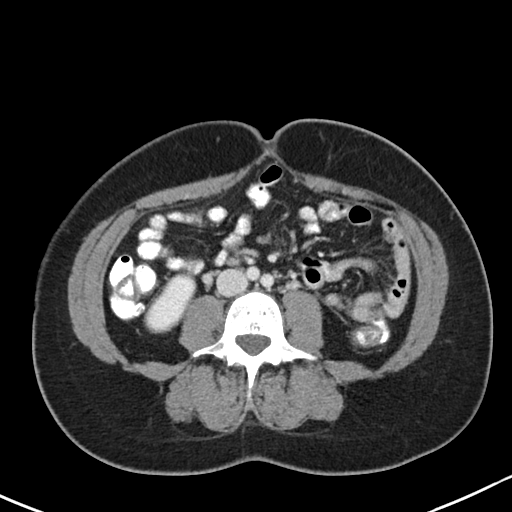
[im 63/90  soft-tissue]
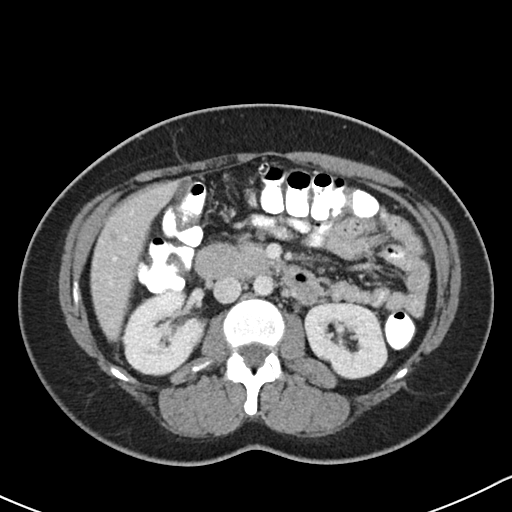
[im 63/90  bone]
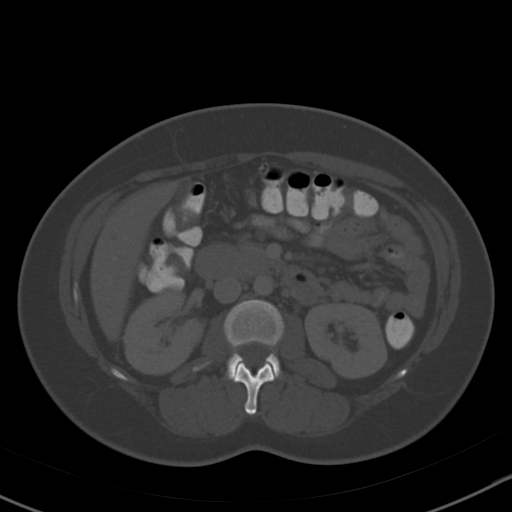
[im 72/90  soft-tissue]
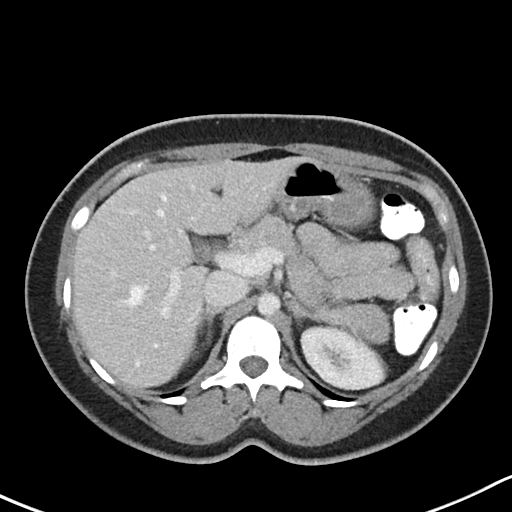
[im 76/90  soft-tissue]
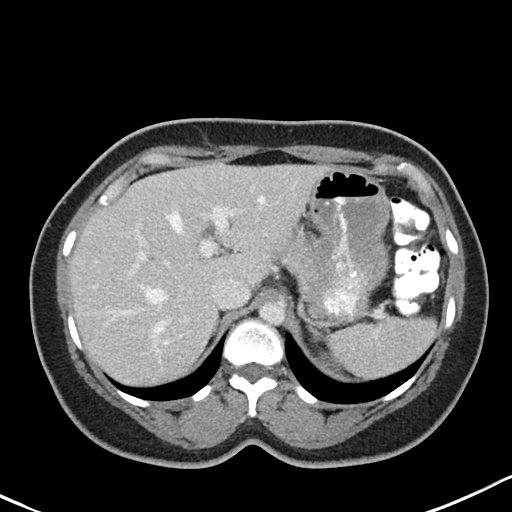
[im 85/90  soft-tissue]
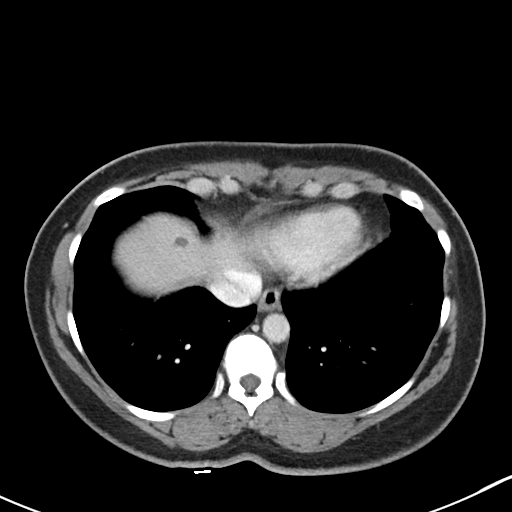

[Series 5: coronal st · coronal · 0.56mm/px · 3 of 65 slices shown]
[im 22/65  soft-tissue]
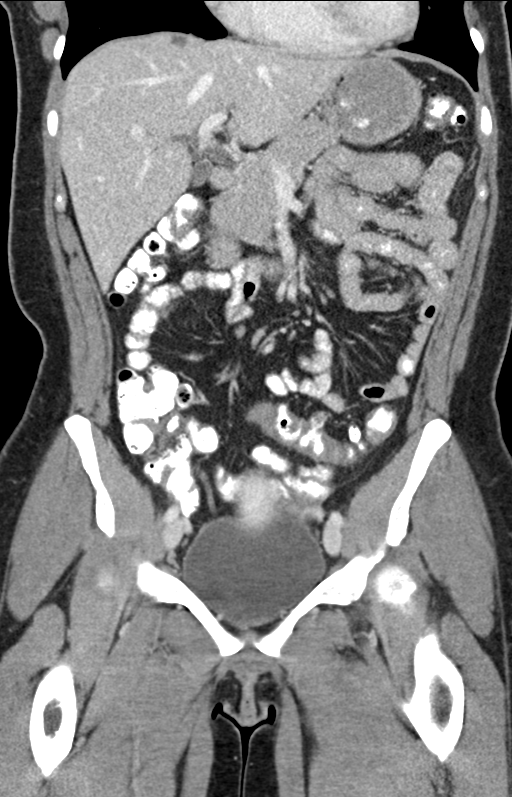
[im 29/65  soft-tissue]
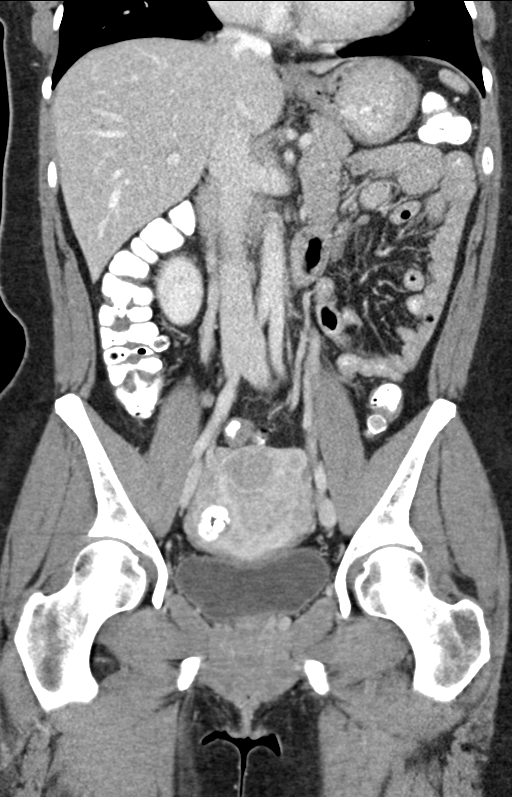
[im 36/65  soft-tissue]
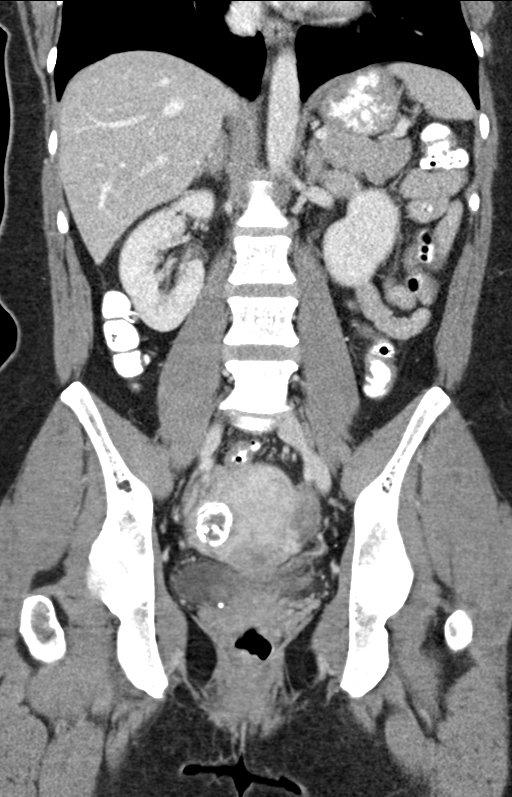

[15 of 46 positions shown; findings below may reference images not displayed]

FINDINGS: Lower chest: Clear lung bases. Normal heart size without pericardial
or pleural effusion.

Hepatobiliary: Scattered subcentimeter right hepatic lobe cysts.
Normal gallbladder, without biliary ductal dilatation.

Pancreas: Favor interdigitation of peripancreatic fat within the
pancreatic head at 2 mm on [DATE]. No duct dilatation or acute
inflammation.

Spleen: Normal in size, without focal abnormality.

Adrenals/Urinary Tract: Normal adrenal glands. Normal kidneys,
without hydronephrosis. Normal urinary bladder.

Stomach/Bowel: Normal stomach, without wall thickening. Extensive
colonic diverticulosis. No residual or recurrent diverticulitis
identified. Normal terminal ileum. Appendix not visualized.

Normal small bowel.

Vascular/Lymphatic: Normal caliber of the aorta and branch vessels.
No abdominopelvic adenopathy.

Reproductive: Uterine fibroids, including a fundal 2.7 cm
hypoattenuating lesion. No adnexal mass.

Other: No significant free fluid.  No free intraperitoneal air.

Musculoskeletal: No acute osseous abnormality. Degenerate disc
disease at the lumbosacral junction.
IMPRESSION: 1. No acute process in the abdomen or pelvis. No explanation for
right-sided pain.
2. Extensive colonic diverticulosis without residual or recurrent
diverticulitis.
3. Uterine fibroids.

## 2021-08-04 ENCOUNTER — Telehealth: Payer: No Typology Code available for payment source | Admitting: Physician Assistant

## 2021-08-04 DIAGNOSIS — R3989 Other symptoms and signs involving the genitourinary system: Secondary | ICD-10-CM | POA: Diagnosis not present

## 2021-08-04 MED ORDER — SULFAMETHOXAZOLE-TRIMETHOPRIM 800-160 MG PO TABS: 1.0000 | ORAL_TABLET | Freq: Two times a day (BID) | ORAL | 0 refills | Status: DC

## 2021-08-04 MED ORDER — FLUCONAZOLE 150 MG PO TABS: 150.0000 mg | ORAL_TABLET | Freq: Once | ORAL | 0 refills | Status: AC

## 2021-08-04 NOTE — Addendum Note (Signed)
Addended by: Mar Daring on: 08/04/2021 06:19 PM   Modules accepted: Orders

## 2021-08-04 NOTE — Progress Notes (Signed)

## 2022-01-11 LAB — HM PAP SMEAR

## 2022-01-11 LAB — HM MAMMOGRAPHY

## 2022-01-21 ENCOUNTER — Telehealth: Payer: No Typology Code available for payment source | Admitting: Family Medicine

## 2022-01-21 DIAGNOSIS — B3731 Acute candidiasis of vulva and vagina: Secondary | ICD-10-CM

## 2022-01-21 MED ORDER — FLUCONAZOLE 150 MG PO TABS
150.0000 mg | ORAL_TABLET | Freq: Once | ORAL | 0 refills | Status: AC
  Filled 2022-01-21: qty 2, 7d supply, fill #0

## 2022-01-21 NOTE — Progress Notes (Signed)

## 2022-01-22 ENCOUNTER — Other Ambulatory Visit (HOSPITAL_COMMUNITY): Payer: Self-pay

## 2022-02-14 ENCOUNTER — Telehealth: Payer: No Typology Code available for payment source | Admitting: Family Medicine

## 2022-02-14 DIAGNOSIS — R3989 Other symptoms and signs involving the genitourinary system: Secondary | ICD-10-CM

## 2022-02-15 ENCOUNTER — Emergency Department (HOSPITAL_BASED_OUTPATIENT_CLINIC_OR_DEPARTMENT_OTHER)
Admission: EM | Admit: 2022-02-15 | Discharge: 2022-02-15 | Disposition: A | Discharge status: Home / Self Care | Payer: No Typology Code available for payment source | Attending: Emergency Medicine | Admitting: Emergency Medicine

## 2022-02-15 ENCOUNTER — Encounter (HOSPITAL_BASED_OUTPATIENT_CLINIC_OR_DEPARTMENT_OTHER): Payer: Self-pay | Admitting: Pharmacy Technician

## 2022-02-15 ENCOUNTER — Other Ambulatory Visit (HOSPITAL_BASED_OUTPATIENT_CLINIC_OR_DEPARTMENT_OTHER): Payer: Self-pay

## 2022-02-15 ENCOUNTER — Other Ambulatory Visit (HOSPITAL_COMMUNITY): Payer: Self-pay

## 2022-02-15 ENCOUNTER — Other Ambulatory Visit: Payer: Self-pay

## 2022-02-15 DIAGNOSIS — R350 Frequency of micturition: Secondary | ICD-10-CM | POA: Insufficient documentation

## 2022-02-15 DIAGNOSIS — R300 Dysuria: Secondary | ICD-10-CM | POA: Diagnosis not present

## 2022-02-15 DIAGNOSIS — R109 Unspecified abdominal pain: Secondary | ICD-10-CM | POA: Insufficient documentation

## 2022-02-15 LAB — URINALYSIS, ROUTINE W REFLEX MICROSCOPIC
Bilirubin Urine: NEGATIVE
Glucose, UA: NEGATIVE mg/dL
Hgb urine dipstick: NEGATIVE
Ketones, ur: NEGATIVE mg/dL
Leukocytes,Ua: NEGATIVE
Nitrite: NEGATIVE
Protein, ur: NEGATIVE mg/dL
Specific Gravity, Urine: 1.019 (ref 1.005–1.030)
pH: 6.5 (ref 5.0–8.0)

## 2022-02-15 LAB — PREGNANCY, URINE: Preg Test, Ur: NEGATIVE

## 2022-02-15 MED ORDER — PHENAZOPYRIDINE HCL 200 MG PO TABS
200.0000 mg | ORAL_TABLET | Freq: Three times a day (TID) | ORAL | 0 refills | Status: DC
  Filled 2022-02-15: qty 6, 2d supply, fill #0

## 2022-02-15 MED ORDER — FLUCONAZOLE 150 MG PO TABS
150.0000 mg | ORAL_TABLET | Freq: Every day | ORAL | 0 refills | Status: DC
  Filled 2022-02-15: qty 1, 1d supply, fill #0

## 2022-02-15 MED ORDER — CEPHALEXIN 500 MG PO CAPS
500.0000 mg | ORAL_CAPSULE | Freq: Three times a day (TID) | ORAL | 0 refills | Status: DC
  Filled 2022-02-15: qty 21, 7d supply, fill #0

## 2022-02-15 MED ORDER — SULFAMETHOXAZOLE-TRIMETHOPRIM 800-160 MG PO TABS
1.0000 | ORAL_TABLET | Freq: Two times a day (BID) | ORAL | 0 refills | Status: DC
  Filled 2022-02-15: qty 10, 5d supply, fill #0

## 2022-02-15 NOTE — ED Provider Notes (Signed)
[facility] EMERGENCY DEPT Provider Note   CSN: [id number] Arrival date & time: 02/15/22  7741     History  Chief Complaint  Patient presents with   Abdominal Pain    Monica Hendrix is a 46 y.o. female.  Patient is a 46 year old female presenting to the ED for evaluation of 12 hours of suprapubic tenderness, urinary frequency, slight dysuria.  Patient also reports mild right flank discomfort without pain.  Patient states she has urinated 4 times since yesterday evening, which is more than she usually does.  Reports mild burning at the end of urination.  Patient states that she has a history of UTIs in the past and has been treated successfully with Keflex.  States the symptoms feel similar to the UTIs she has had in the past. states she also gets frequent vaginal yeast infections are treated adequately with Diflucan.  Patient is traveling to [place] later today and would like to be treated prior to her travel.  Patient has a known allergy to [place].  Patient reports being in a monogamous relationship and has little to no risk for sexually transmitted diseases.  Denies fevers, chills, sweats, abdominal pain, vaginal itching, pain, odor, discharge.   Abdominal Pain      Home Medications Prior to Admission medications   Medication Sig Start Date End Date Taking? Authorizing Provider  acetaminophen (TYLENOL) 325 MG tablet Take 2 tablets (650 mg total) by mouth every 6 (six) hours as needed for mild pain (or temp > 100). 11/06/19   Simaan, Darci Current, PA-C  Ascorbic Acid (VITAMIN C PO) Take by mouth.    [provider]  b complex vitamins tablet Take 1 tablet by mouth daily.    [provider]  cholecalciferol (VITAMIN D3) 25 MCG (1000 UT) tablet Take 1,000 Units by mouth 2 (two) times daily.    [provider]  Multiple Vitamins-Minerals (MULTIVITAMIN WITH MINERALS) tablet Take 1 tablet by mouth daily.    [provider]   sulfamethoxazole-trimethoprim (BACTRIM DS) 800-160 MG tablet Take 1 tablet by mouth 2 (two) times daily for 5 days. 02/15/22 02/20/22  Perlie Mayo, NP  QUEtiapine (SEROQUEL) 25 MG tablet Take 1-2 tablets (25-50 mg total) by mouth at bedtime. 01/05/19 05/08/19  Forrest Moron, MD      Allergies    Macrobid [nitrofurantoin macrocrystal] and Hydrocodone    Review of Systems   Review of Systems  Gastrointestinal:  Positive for abdominal pain.    Physical Exam Updated Vital Signs BP (!) 149/84   Pulse 98   Temp 98.1 F (36.7 C) (Oral)   Resp 16   SpO2 99%  Physical Exam Vitals and nursing note reviewed.  Constitutional:      General: She is not in acute distress.    Appearance: She is well-developed. She is not ill-appearing.  HENT:     Head: Normocephalic and atraumatic.  Eyes:     Conjunctiva/sclera: Conjunctivae normal.  Cardiovascular:     Rate and Rhythm: Normal rate and regular rhythm.     Heart sounds: No murmur heard. Pulmonary:     Effort: Pulmonary effort is normal. No respiratory distress.     Breath sounds: Normal breath sounds.  Abdominal:     Palpations: Abdomen is soft.     Tenderness: There is abdominal tenderness in the suprapubic area. There is no right CVA tenderness or left CVA tenderness.  Musculoskeletal:        General: No swelling.  Cervical back: Neck supple.  Skin:    General: Skin is warm and dry.  Neurological:     Mental Status: She is alert.  Psychiatric:        Mood and Affect: Mood normal.     ED Results / Procedures / Treatments   Labs (all labs ordered are listed, but only abnormal results are displayed) Labs Reviewed  URINALYSIS, [place], URINE    EKG None  Radiology No results found.  Procedures Procedures    Medications Ordered in ED Medications - No data to display  ED Course/ Medical Decision Making/ A&P                           Medical Decision Making Additional history  obtained from: Nursing notes from this visit. Prior ED visit on 08/03/2020 and 08/15/2020 for UTI symptoms.  Visit on 08/03/2020 showed signs of urinary tract infection and patient was a treated appropriately with Keflex and Diflucan.  Urinalysis on 08/15/2020 did not show signs of infection and patient was treated for dysuria.   I ordered, reviewed and interpreted labs which include: Urine pregnancy test and urinalysis Urine pregnancy test negative, urinalysis does not show signs of infection.  Due to patient's history of frequent UTIs and successful treatment with Keflex, we will treat her at this time for her urinary symptoms with Keflex, pyridium for dysuria, and Diflucan for vaginal candidiasis side effect of the antibiotic.   Patient is hemodynamically stable and shows no signs of pyelonephritis or complicated cystitis.  At this time there does not appear to be any evidence of an acute emergency medical condition and the patient appears stable for discharge with appropriate outpatient follow up. Diagnosis was discussed with patient who verbalizes understanding of care plan and is agreeable to discharge. I have discussed return precautions with patient who verbalizes understanding. Patient encouraged to follow-up with their PCP within 2 weeks. All questions answered.  Patient's case discussed with Dr. Kathrynn Humble who agrees with plan to discharge with follow-up.   Note: Portions of this report may have been transcribed using voice recognition software. Every effort was made to ensure accuracy; however, inadvertent computerized transcription errors may still be present.   Amount and/or Complexity of Data Reviewed Labs: ordered.          Final Clinical Impression(s) / ED Diagnoses Final diagnoses:  None    Rx / DC Orders ED Discharge Orders     None         Roylene Reason, PA-C 02/15/22 0827    Varney Biles, MD 02/15/22 [phone]

## 2022-02-15 NOTE — Progress Notes (Signed)

## 2022-02-15 NOTE — ED Triage Notes (Signed)
Pt here with abdominal pressure, R flank pain, urinary frequency and pain with urination onset yesterday.

## 2022-02-15 NOTE — Discharge Instructions (Signed)
At this time there does not appear to be the presence of an emergent medical condition, however there is always the potential for conditions to change. Please read and follow the below instructions.  Please return to the Emergency Department immediately for any new or worsening symptoms or if your symptoms do not improve within 7 days. Please be sure to follow up with your Primary Care Provider within one week regarding your visit today; please call their office to schedule an appointment even if you are feeling better for a follow-up visit. Take the keflex antibiotic three times per day for 5 days for your UTI symptoms. Take the diflucan pill once if you feel vaginal yeast infection symptoms. Take the pyridium as needed for pain with urination.    Please read the additional information packets attached to your discharge summary.  Do not take your medicine if  develop an itchy rash, swelling in your mouth or lips, or difficulty breathing; call 911 and seek immediate emergency medical attention if this occurs.  You may review your lab tests and imaging results in their entirety on your MyChart account.  Please discuss all results of fully with your primary care provider and other specialist at your follow-up visit.  Note: Portions of this text may have been transcribed using voice recognition software. Every effort was made to ensure accuracy; however, inadvertent computerized transcription errors may still be present.  Get help right away if: Your pain is severe and not relieved with medicines. You cannot eat or drink without vomiting. You are confused. You have a rapid heartbeat while resting. You have shaking or chills. You feel extremely weak.

## 2022-02-15 NOTE — ED Notes (Signed)
Patient Alert and oriented to baseline. Stable and ambulatory to baseline. Patient verbalized understanding of the discharge instructions.  Patient belongings were taken by the patient.   

## 2022-02-25 ENCOUNTER — Emergency Department (HOSPITAL_BASED_OUTPATIENT_CLINIC_OR_DEPARTMENT_OTHER)
Admission: EM | Admit: 2022-02-25 | Discharge: 2022-02-25 | Disposition: A | Discharge status: Other Acute Inpatient Hospital | Payer: No Typology Code available for payment source

## 2022-02-25 ENCOUNTER — Ambulatory Visit
Admission: RE | Admit: 2022-02-25 | Discharge: 2022-02-25 | Disposition: A | Discharge status: Home / Self Care | Payer: No Typology Code available for payment source | Source: Ambulatory Visit | Attending: Physician Assistant | Admitting: Physician Assistant

## 2022-02-25 ENCOUNTER — Inpatient Hospital Stay (HOSPITAL_BASED_OUTPATIENT_CLINIC_OR_DEPARTMENT_OTHER)
Admission: RE | Admit: 2022-02-25 | Payer: No Typology Code available for payment source | Source: Ambulatory Visit | Admitting: Radiology

## 2022-02-25 VITALS — BP 130/82 | HR 103 | Temp 98.3°F | Resp 18

## 2022-02-25 DIAGNOSIS — M79675 Pain in left toe(s): Secondary | ICD-10-CM

## 2022-02-25 DIAGNOSIS — M7989 Other specified soft tissue disorders: Secondary | ICD-10-CM | POA: Diagnosis not present

## 2022-02-25 MED ORDER — NAPROXEN 375 MG PO TABS: 375.0000 mg | ORAL_TABLET | Freq: Two times a day (BID) | ORAL | 0 refills | Status: DC

## 2022-02-25 NOTE — ED Triage Notes (Signed)
Pt is present today with c/o left second toe pain. Pt states that she has noticed irritation and swelling Friday.  Pt denies any injury.

## 2022-02-25 NOTE — ED Provider Notes (Signed)
[name]    CSN: [id number] Arrival date & time: 02/25/22  0901      History   Chief Complaint Chief Complaint  Patient presents with   Toe Pain    Entered by patient    HPI Monica Hendrix is a 46 y.o. female.   Patient presents today with a 3-day history of left second toe pain and swelling.  She reports that she woke up 1 morning with it pain and very swollen.  She reports that she has been soaking it and using Tylenol with improvement in the swelling but persistent pain.  Pain is rated 7/8 on a 0-10 pain scale, described as throbbing, no aggravating or alleviating factors identified.  She denies known insect bite or injury.  She did initially have some itching but this is since resolved.  She denies any obvious bite or skin abnormality.  She denies history of gout or rheumatoid arthritis.  She is having difficulty with prolonged ambulation due to pain.  Denies any fever, nausea, vomiting.    Past Medical History:  Diagnosis Date   Diverticulitis    Fibroids    Infection of cesarean section or perineal wound 04/24/2011    Patient Active Problem List   Diagnosis Date Noted   Routine general medical examination at a health care facility 09/15/2020   Visit for screening mammogram 09/15/2020   Perforation of sigmoid colon due to diverticulitis 10/30/2019   Family history of colon cancer in father 10/30/2019   Uterine leiomyoma 05/02/2017    Past Surgical History:  Procedure Laterality Date   CESAREAN SECTION     CESAREAN SECTION  04/14/2011   Procedure: CESAREAN SECTION;  Surgeon: Frederico Hamman, MD;  Location: [facility];  Service: Gynecology;  Laterality: N/A;  Repeat of baby girl at [address] 2/7    OB History     Gravida  3   Para  2   Term  1   Preterm  1   AB  1   Living  2      SAB  1   IAB  0   Ectopic  0   Multiple  0   Live Births  1            Home Medications    Prior to Admission medications   Medication  Sig Start Date End Date Taking? Authorizing Provider  naproxen (NAPROSYN) 375 MG tablet Take 1 tablet (375 mg total) by mouth 2 (two) times daily. 02/25/22  Yes Andera Cranmer, Derry Skill, PA-C  acetaminophen (TYLENOL) 325 MG tablet Take 2 tablets (650 mg total) by mouth every 6 (six) hours as needed for mild pain (or temp > 100). 11/06/19   Simaan, Darci Current, PA-C  Ascorbic Acid (VITAMIN C PO) Take by mouth.    [provider]  b complex vitamins tablet Take 1 tablet by mouth daily.    [provider]  cephALEXin (KEFLEX) 500 MG capsule Take 1 capsule (500 mg total) by mouth 3 (three) times daily. 02/15/22   Varney Biles, MD  cholecalciferol (VITAMIN D3) 25 MCG (1000 UT) tablet Take 1,000 Units by mouth 2 (two) times daily.    [provider]  fluconazole (DIFLUCAN) 150 MG tablet Take 1 tablet (150 mg total) by mouth daily. 02/15/22   Schutt, Grafton Folk, PA-C  Multiple Vitamins-Minerals (MULTIVITAMIN WITH MINERALS) tablet Take 1 tablet by mouth daily.    [provider]  phenazopyridine (PYRIDIUM) 200 MG tablet Take 1 tablet (  200 mg total) by mouth 3 (three) times daily. 02/15/22   Varney Biles, MD  QUEtiapine (SEROQUEL) 25 MG tablet Take 1-2 tablets (25-50 mg total) by mouth at bedtime. 01/05/19 05/08/19  Forrest Moron, MD    Family History Family History  Problem Relation Age of Onset   Diabetes Mother    Hypertension Mother    Cancer Father    Diabetes Father     Social History Social History   Tobacco Use   Smoking status: Never   Smokeless tobacco: Never  Vaping Use   Vaping Use: Never used  Substance Use Topics   Alcohol use: Yes    Comment: SOCIALLY   Drug use: No     Allergies   Macrobid [nitrofurantoin macrocrystal] and Hydrocodone   Review of Systems Review of Systems  Constitutional:  Positive for activity change. Negative for appetite change, fatigue and fever.  Gastrointestinal:  Negative for diarrhea, nausea and vomiting.   Musculoskeletal:  Positive for arthralgias, gait problem and joint swelling. Negative for myalgias.  Neurological:  Negative for dizziness, weakness, light-headedness, numbness and headaches.     Physical Exam Triage Vital Signs ED Triage Vitals  Enc Vitals Group     BP 02/25/22 0911 (!) 160/92     Pulse Rate 02/25/22 0911 (!) 103     Resp 02/25/22 0911 18     Temp 02/25/22 0911 98.3 F (36.8 C)     Temp src --      SpO2 02/25/22 0911 98 %     Weight --      Height --      Head Circumference --      Peak Flow --      Pain Score 02/25/22 0910 6     Pain Loc --      Pain Edu? --      Excl. in [place]? --    No data found.  Updated Vital Signs BP 130/82   Pulse (!) 103   Temp 98.3 F (36.8 C)   Resp 18   SpO2 98%   Visual Acuity Right Eye Distance:   Left Eye Distance:   Bilateral Distance:    Right Eye Near:   Left Eye Near:    Bilateral Near:     Physical Exam Vitals reviewed.  Constitutional:      General: She is awake. She is not in acute distress.    Appearance: Normal appearance. She is well-developed. She is not ill-appearing.     Comments: Very pleasant female appears stated age in no acute distress sitting comfortably in exam room  HENT:     Head: Normocephalic and atraumatic.  Cardiovascular:     Rate and Rhythm: Normal rate and regular rhythm.     Heart sounds: Normal heart sounds, S1 normal and S2 normal. No murmur heard.    Comments: Capillary refill within 2 seconds left second toe Pulmonary:     Effort: Pulmonary effort is normal.     Breath sounds: Normal breath sounds. No wheezing, rhonchi or rales.  Musculoskeletal:     Left foot: Normal range of motion. No deformity or bunion.       Feet:  Feet:     Left foot:     Protective Sensation: 10 sites tested.  10 sites sensed.     Skin integrity: No ulcer, blister or skin breakdown.     Comments: Significant tenderness palpation at proximal left toe without deformity.  Foot neurovascularly  intact.  No bruising  or skin change. Psychiatric:        Behavior: Behavior is cooperative.      UC Treatments / Results  Labs (all labs ordered are listed, but only abnormal results are displayed) Labs Reviewed - No data to display  EKG   Radiology No results found.  Procedures Procedures (including critical care time)  Medications Ordered in UC Medications - No data to display  Initial Impression / Assessment and Plan / UC Course  I have reviewed the triage vital signs and the nursing notes.  Pertinent labs & imaging results that were available during my care of the patient were reviewed by me and considered in my medical decision making (see chart for details).     Unclear etiology of symptoms.  Given significant point tenderness will obtain x-ray.  Unfortunately, we do not have x-ray in clinic so patient will go to [place] for outpatient x-ray and we will contact her with these results.  She was placed in a postop shoe for comfort and support.  Recommended conservative treatment measures including RICE protocol.  She was started on Naprosyn twice daily with instruction not to take additional NSAIDs with this medication due to risk of GI bleeding.  She can use Tylenol for breakthrough pain.  Discussed that if her symptoms persist she should follow-up with podiatrist was given contact information for local provider with instruction to call to schedule an appointment.  If she has any worsening symptoms she is to return for reevaluation.  Strict return precautions given.  Work excuse note provided.  Final Clinical Impressions(s) / UC Diagnoses   Final diagnoses:  Toe pain, left  Toe swelling     Discharge Instructions      Go get your x-ray.  I will contact you if there is anything abnormal.  Use the postop shoe for comfort and support.  Start Naprosyn twice daily.  Do not take NSAIDs including aspirin, ibuprofen/Advil, naproxen/Aleve with this medication as  it can cause stomach bleeding.  You can use Tylenol for breakthrough pain.  If your symptoms or not improving please follow-up with podiatry.  Call to schedule an appointment.  If anything worsens and you have discoloration of the toe, increased pain, numbness/tingling, fever you should be seen immediately.     ED Prescriptions     Medication Sig Dispense Auth. Provider   naproxen (NAPROSYN) 375 MG tablet Take 1 tablet (375 mg total) by mouth 2 (two) times daily. 20 tablet Jaclynn Laumann, Derry Skill, PA-C      PDMP not reviewed this encounter.   Terrilee Croak, PA-C 02/25/22 1012

## 2022-02-25 NOTE — ED Notes (Signed)
Patient registered incorrectly as Emergency Department Patient. Patient to be dismissed.

## 2022-02-25 NOTE — Discharge Instructions (Addendum)
Go get your x-ray.  I will contact you if there is anything abnormal.  Use the postop shoe for comfort and support.  Start Naprosyn twice daily.  Do not take NSAIDs including aspirin, ibuprofen/Advil, naproxen/Aleve with this medication as it can cause stomach bleeding.  You can use Tylenol for breakthrough pain.  If your symptoms or not improving please follow-up with podiatry.  Call to schedule an appointment.  If anything worsens and you have discoloration of the toe, increased pain, numbness/tingling, fever you should be seen immediately.

## 2022-03-08 ENCOUNTER — Ambulatory Visit (INDEPENDENT_AMBULATORY_CARE_PROVIDER_SITE_OTHER): Payer: No Typology Code available for payment source | Admitting: Internal Medicine

## 2022-03-08 ENCOUNTER — Encounter: Payer: Self-pay | Admitting: Internal Medicine

## 2022-03-08 VITALS — BP 126/80 | HR 80 | Temp 98.2°F | Resp 16 | Ht 62.0 in | Wt 162.4 lb

## 2022-03-08 DIAGNOSIS — Z0000 Encounter for general adult medical examination without abnormal findings: Secondary | ICD-10-CM | POA: Diagnosis not present

## 2022-03-08 NOTE — Progress Notes (Signed)
Subjective:  Patient ID: Monica Hendrix, female    DOB: 01-04-1976  Age: 46 y.o. MRN: [id number]  CC: Annual Exam   HPI Monica Hendrix presents for a CPX - She feels well.  Outpatient Medications Prior to Visit  Medication Sig Dispense Refill   acetaminophen (TYLENOL) 325 MG tablet Take 2 tablets (650 mg total) by mouth every 6 (six) hours as needed for mild pain (or temp > 100).     Ascorbic Acid (VITAMIN C PO) Take by mouth.     b complex vitamins tablet Take 1 tablet by mouth daily.     cephALEXin (KEFLEX) 500 MG capsule Take 1 capsule (500 mg total) by mouth 3 (three) times daily. 21 capsule 0   cholecalciferol (VITAMIN D3) 25 MCG (1000 UT) tablet Take 1,000 Units by mouth 2 (two) times daily.     fluconazole (DIFLUCAN) 150 MG tablet Take 1 tablet (150 mg total) by mouth daily. 1 tablet 0   Multiple Vitamins-Minerals (MULTIVITAMIN WITH MINERALS) tablet Take 1 tablet by mouth daily.     naproxen (NAPROSYN) 375 MG tablet Take 1 tablet (375 mg total) by mouth 2 (two) times daily. 20 tablet 0   phenazopyridine (PYRIDIUM) 200 MG tablet Take 1 tablet (200 mg total) by mouth 3 (three) times daily. 6 tablet 0   No facility-administered medications prior to visit.    ROS Review of Systems  Constitutional: Negative.  Negative for diaphoresis and fatigue.  HENT: Negative.    Eyes: Negative.   Respiratory:  Negative for cough and shortness of breath.   Cardiovascular:  Negative for chest pain, palpitations and leg swelling.  Gastrointestinal:  Negative for abdominal pain and diarrhea.  Endocrine: Negative.   Genitourinary: Negative.  Negative for difficulty urinating and dysuria.  Musculoskeletal: Negative.   Skin: Negative.   Allergic/Immunologic: Negative.   Neurological: Negative.   Hematological: Negative.   Psychiatric/Behavioral: Negative.      Objective:  BP 126/80 (BP Location: Right Arm, Patient Position: Sitting, Cuff Size: Large)   Pulse 80   Temp 98.2 F  (36.8 C) (Oral)   Resp 16   Ht '5\' 2"'$  (1.575 m)   Wt 162 lb 6.4 oz (73.7 kg)   LMP 02/22/2022 (Exact Date)   SpO2 93%   BMI 29.70 kg/m   BP Readings from Last 3 Encounters:  03/08/22 126/80  02/25/22 130/82  02/15/22 (!) 149/84    Wt Readings from Last 3 Encounters:  03/08/22 162 lb 6.4 oz (73.7 kg)  09/15/20 158 lb (71.7 kg)  08/16/20 158 lb (71.7 kg)    Physical Exam Vitals reviewed.  HENT:     Mouth/Throat:     Mouth: Mucous membranes are moist.  Eyes:     General: No scleral icterus.    Conjunctiva/sclera: Conjunctivae normal.  Cardiovascular:     Rate and Rhythm: Normal rate and regular rhythm.     Heart sounds: No murmur heard. Pulmonary:     Effort: Pulmonary effort is normal.     Breath sounds: No stridor. No wheezing, rhonchi or rales.  Abdominal:     General: Abdomen is flat.     Palpations: There is no mass.     Tenderness: There is no abdominal tenderness. There is no guarding.     Hernia: No hernia is present.  Musculoskeletal:        General: Normal range of motion.     Cervical back: Neck supple.     Right lower leg: No edema.  Left lower leg: No edema.  Lymphadenopathy:     Cervical: No cervical adenopathy.  Skin:    General: Skin is warm and dry.  Neurological:     General: No focal deficit present.     Mental Status: She is alert.  Psychiatric:        Mood and Affect: Mood normal.        Behavior: Behavior normal.     Lab Results  Component Value Date   WBC 9.3 08/16/2020   HGB 13.1 08/16/2020   HCT 38.3 08/16/2020   PLT 314.0 08/16/2020   GLUCOSE 90 08/16/2020   CHOL 180 09/16/2020   TRIG 26.0 09/16/2020   HDL 73.50 09/16/2020   LDLCALC 102 (H) 09/16/2020   ALT 10 08/16/2020   AST 16 08/16/2020   NA 135 08/16/2020   K 4.3 08/16/2020   CL 103 08/16/2020   CREATININE 0.67 08/16/2020   BUN 11 08/16/2020   CO2 29 08/16/2020   TSH 1.070 01/01/2019   HGBA1C 5.4 10/24/2017    No results found.  Assessment & Plan:    Monica Hendrix was seen today for annual exam.  Diagnoses and all orders for this visit:  Routine general medical examination at a health care facility- Exam completed, no labs indicated, cancer screenings are up-to-date, vaccines are up-to-date, patient education was given.,   I am having Monica Hendrix maintain her multivitamin with minerals, cholecalciferol, b complex vitamins, acetaminophen, Ascorbic Acid (VITAMIN C PO), cephALEXin, phenazopyridine, fluconazole, and naproxen.  No orders of the defined types were placed in this encounter.    Follow-up: No follow-ups on file.  Monica Calico, MD

## 2022-04-23 ENCOUNTER — Ambulatory Visit (INDEPENDENT_AMBULATORY_CARE_PROVIDER_SITE_OTHER): Payer: No Typology Code available for payment source | Admitting: Internal Medicine

## 2022-04-23 ENCOUNTER — Encounter: Payer: Self-pay | Admitting: Internal Medicine

## 2022-04-23 VITALS — BP 126/74 | HR 90 | Temp 98.0°F | Resp 16 | Wt 160.0 lb

## 2022-04-23 DIAGNOSIS — R10813 Right lower quadrant abdominal tenderness: Secondary | ICD-10-CM | POA: Diagnosis not present

## 2022-04-23 LAB — CBC WITH DIFFERENTIAL/PLATELET
Basophils Absolute: 0.1 10*3/uL (ref 0.0–0.1)
Basophils Relative: 0.8 % (ref 0.0–3.0)
Eosinophils Absolute: 0.2 10*3/uL (ref 0.0–0.7)
Eosinophils Relative: 2.1 % (ref 0.0–5.0)
HCT: 39.3 % (ref 36.0–46.0)
Hemoglobin: 13.4 g/dL (ref 12.0–15.0)
Lymphocytes Relative: 27.6 % (ref 12.0–46.0)
Lymphs Abs: 2.2 10*3/uL (ref 0.7–4.0)
MCHC: 34.2 g/dL (ref 30.0–36.0)
MCV: 98.7 fl (ref 78.0–100.0)
Monocytes Absolute: 0.4 10*3/uL (ref 0.1–1.0)
Monocytes Relative: 5.6 % (ref 3.0–12.0)
Neutro Abs: 5.1 10*3/uL (ref 1.4–7.7)
Neutrophils Relative %: 63.9 % (ref 43.0–77.0)
Platelets: 321 10*3/uL (ref 150.0–400.0)
RBC: 3.98 Mil/uL (ref 3.87–5.11)
RDW: 13 % (ref 11.5–15.5)
WBC: 7.9 10*3/uL (ref 4.0–10.5)

## 2022-04-23 LAB — HEPATIC FUNCTION PANEL
ALT: 14 U/L (ref 0–35)
AST: 21 U/L (ref 0–37)
Albumin: 4.2 g/dL (ref 3.5–5.2)
Alkaline Phosphatase: 61 U/L (ref 39–117)
Bilirubin, Direct: 0.2 mg/dL (ref 0.0–0.3)
Total Bilirubin: 0.6 mg/dL (ref 0.2–1.2)
Total Protein: 7.5 g/dL (ref 6.0–8.3)

## 2022-04-23 LAB — URINALYSIS, ROUTINE W REFLEX MICROSCOPIC
Bilirubin Urine: NEGATIVE
Hgb urine dipstick: NEGATIVE
Ketones, ur: NEGATIVE
Leukocytes,Ua: NEGATIVE
Nitrite: NEGATIVE
RBC / HPF: NONE SEEN (ref 0–?)
Specific Gravity, Urine: 1.025 (ref 1.000–1.030)
Total Protein, Urine: NEGATIVE
Urine Glucose: NEGATIVE
Urobilinogen, UA: 0.2 (ref 0.0–1.0)
pH: 6 (ref 5.0–8.0)

## 2022-04-23 LAB — BASIC METABOLIC PANEL
BUN: 9 mg/dL (ref 6–23)
CO2: 26 mEq/L (ref 19–32)
Calcium: 8.7 mg/dL (ref 8.4–10.5)
Chloride: 103 mEq/L (ref 96–112)
Creatinine, Ser: 0.7 mg/dL (ref 0.40–1.20)
GFR: 103.32 mL/min (ref >60.00)
Glucose, Bld: 106 mg/dL — ABNORMAL HIGH (ref 70–99)
Potassium: 3.8 mEq/L (ref 3.5–5.1)
Sodium: 138 mEq/L (ref 135–145)

## 2022-04-23 LAB — LIPASE: Lipase: 19 U/L (ref 11.0–59.0)

## 2022-04-23 LAB — C-REACTIVE PROTEIN: CRP: <1 mg/dL (ref 0.5–20.0)

## 2022-04-23 LAB — HCG, QUANTITATIVE, PREGNANCY: Quantitative HCG: <0.6 m[IU]/mL

## 2022-04-23 NOTE — Progress Notes (Unsigned)
Subjective:  Patient ID: Monica Hendrix, female    DOB: 04-17-1976  Age: 46 y.o. MRN: [id number]  CC: Abdominal Pain   HPI Monica Hendrix presents for abd pain -  About 4 days ago she developed the acute onset of watery diarrhea.  She took Imodium AD and the diarrhea resolved.  Her last normal bowel movement was about 12 hours ago.  During this she is also had right lower quadrant pain that she describes as a tooth ache.  That pain is rapidly resolving.  Outpatient Medications Prior to Visit  Medication Sig Dispense Refill   acetaminophen (TYLENOL) 325 MG tablet Take 2 tablets (650 mg total) by mouth every 6 (six) hours as needed for mild pain (or temp > 100).     Ascorbic Acid (VITAMIN C PO) Take by mouth.     b complex vitamins tablet Take 1 tablet by mouth daily.     cephALEXin (KEFLEX) 500 MG capsule Take 1 capsule (500 mg total) by mouth 3 (three) times daily. 21 capsule 0   cholecalciferol (VITAMIN D3) 25 MCG (1000 UT) tablet Take 1,000 Units by mouth 2 (two) times daily.     fluconazole (DIFLUCAN) 150 MG tablet Take 1 tablet (150 mg total) by mouth daily. 1 tablet 0   Multiple Vitamins-Minerals (MULTIVITAMIN WITH MINERALS) tablet Take 1 tablet by mouth daily.     naproxen (NAPROSYN) 375 MG tablet Take 1 tablet (375 mg total) by mouth 2 (two) times daily. 20 tablet 0   phenazopyridine (PYRIDIUM) 200 MG tablet Take 1 tablet (200 mg total) by mouth 3 (three) times daily. 6 tablet 0   No facility-administered medications prior to visit.    ROS Review of Systems  Constitutional:  Negative for chills, diaphoresis, fatigue and fever.  HENT: Negative.    Eyes: Negative.   Respiratory:  Negative for cough, chest tightness, shortness of breath and wheezing.   Cardiovascular:  Negative for chest pain, palpitations and leg swelling.  Gastrointestinal:  Positive for abdominal pain. Negative for blood in stool, constipation, diarrhea, nausea and vomiting.  Endocrine: Negative.    Genitourinary:  Negative for decreased urine volume, difficulty urinating, dysuria, flank pain, hematuria, urgency, vaginal bleeding and vaginal discharge.  Musculoskeletal: Negative.   Skin: Negative.  Negative for rash.  Neurological: Negative.  Negative for dizziness, weakness and light-headedness.  Hematological:  Negative for adenopathy. Does not bruise/bleed easily.  Psychiatric/Behavioral: Negative.      Objective:  BP 126/74 (BP Location: Right Arm, Patient Position: Sitting, Cuff Size: Normal)   Pulse 90   Temp 98 F (36.7 C) (Oral)   Resp 16   Wt 160 lb (72.6 kg)   LMP 04/02/2022 (Exact Date)   SpO2 98%   BMI 29.26 kg/m   BP Readings from Last 3 Encounters:  04/23/22 126/74  03/08/22 126/80  02/25/22 130/82    Wt Readings from Last 3 Encounters:  04/23/22 160 lb (72.6 kg)  03/08/22 162 lb 6.4 oz (73.7 kg)  09/15/20 158 lb (71.7 kg)    Physical Exam Vitals reviewed.  Constitutional:      General: She is not in acute distress.    Appearance: She is not ill-appearing, toxic-appearing or diaphoretic.  HENT:     Nose: Nose normal.     Mouth/Throat:     Mouth: Mucous membranes are moist.  Eyes:     General: No scleral icterus.    Conjunctiva/sclera: Conjunctivae normal.  Cardiovascular:     Rate and Rhythm: Normal  rate and regular rhythm.     Heart sounds: No murmur heard. Pulmonary:     Effort: Pulmonary effort is normal.     Breath sounds: No stridor. No wheezing, rhonchi or rales.  Abdominal:     General: Abdomen is flat. Bowel sounds are decreased.     Palpations: There is no hepatomegaly, splenomegaly or mass.     Tenderness: There is abdominal tenderness in the right lower quadrant. There is no right CVA tenderness, left CVA tenderness, guarding or rebound.  Musculoskeletal:        General: Normal range of motion.     Cervical back: Neck supple.     Right lower leg: No edema.     Left lower leg: No edema.  Lymphadenopathy:     Cervical: No  cervical adenopathy.  Skin:    General: Skin is warm and dry.  Neurological:     General: No focal deficit present.  Psychiatric:        Mood and Affect: Mood normal.        Behavior: Behavior normal.     Lab Results  Component Value Date   WBC 7.9 04/23/2022   HGB 13.4 04/23/2022   HCT 39.3 04/23/2022   PLT 321.0 04/23/2022   GLUCOSE 106 (H) 04/23/2022   CHOL 180 09/16/2020   TRIG 26.0 09/16/2020   HDL 73.50 09/16/2020   LDLCALC 102 (H) 09/16/2020   ALT 14 04/23/2022   AST 21 04/23/2022   NA 138 04/23/2022   K 3.8 04/23/2022   CL 103 04/23/2022   CREATININE 0.70 04/23/2022   BUN 9 04/23/2022   CO2 26 04/23/2022   TSH 1.070 01/01/2019   HGBA1C 5.4 10/24/2017    No results found.  Assessment & Plan:   Monica Hendrix was seen today for abdominal pain.  Diagnoses and all orders for this visit:  Right lower quadrant abdominal tenderness without rebound tenderness- Her symptoms have resolved.  Her exam is not consistent with an acute abdominal process and her labs are all normal.  She will let me know if she has any new or worsening symptoms. -     Lipase; Future -     Urinalysis, Routine w reflex microscopic; Future -     C-reactive protein; Future -     hCG, quantitative, pregnancy; Future -     CBC with Differential/Platelet; Future -     Basic metabolic panel; Future -     Hepatic function panel; Future -     Hepatic function panel -     Basic metabolic panel -     CBC with Differential/Platelet -     hCG, quantitative, pregnancy -     C-reactive protein -     Urinalysis, Routine w reflex microscopic -     Lipase   I am having Monica Hendrix maintain her multivitamin with minerals, cholecalciferol, b complex vitamins, acetaminophen, Ascorbic Acid (VITAMIN C PO), cephALEXin, phenazopyridine, fluconazole, and naproxen.  No orders of the defined types were placed in this encounter.    Follow-up: No follow-ups on file.  Monica Calico, MD

## 2022-04-24 ENCOUNTER — Encounter: Payer: Self-pay | Admitting: Internal Medicine

## 2022-04-24 NOTE — Patient Instructions (Signed)

## 2022-08-16 ENCOUNTER — Telehealth: Payer: 59 | Admitting: Physician Assistant

## 2022-08-16 ENCOUNTER — Other Ambulatory Visit (HOSPITAL_COMMUNITY): Payer: Self-pay

## 2022-08-16 DIAGNOSIS — B3731 Acute candidiasis of vulva and vagina: Secondary | ICD-10-CM

## 2022-08-16 MED ORDER — FLUCONAZOLE 150 MG PO TABS
ORAL_TABLET | ORAL | 0 refills | Status: DC
  Filled 2022-08-16: qty 2, 4d supply, fill #0

## 2022-08-16 NOTE — Progress Notes (Signed)

## 2022-08-16 NOTE — Progress Notes (Signed)
I have spent 5 minutes in review of e-visit questionnaire, review and updating patient chart, medical decision making and response to patient.   Stefanee Mckell Cody Harlie Ragle, PA-C    

## 2022-12-27 ENCOUNTER — Telehealth: Payer: 59 | Admitting: Physician Assistant

## 2022-12-27 DIAGNOSIS — R3989 Other symptoms and signs involving the genitourinary system: Secondary | ICD-10-CM | POA: Diagnosis not present

## 2022-12-27 MED ORDER — CEPHALEXIN 500 MG PO CAPS
500.0000 mg | ORAL_CAPSULE | Freq: Two times a day (BID) | ORAL | 0 refills | Status: AC
  Filled 2022-12-27 – 2022-12-28 (×2): qty 14, 7d supply, fill #0

## 2022-12-27 MED ORDER — FLUCONAZOLE 150 MG PO TABS
150.0000 mg | ORAL_TABLET | Freq: Once | ORAL | 0 refills | Status: AC
  Filled 2022-12-27 – 2022-12-28 (×2): qty 1, 1d supply, fill #0

## 2022-12-27 NOTE — Progress Notes (Signed)
E-Visit for Urinary Problems  We are sorry that you are not feeling well.  Here is how we plan to help!  Based on what you shared with me it looks like you most likely have a simple urinary tract infection.  A UTI (Urinary Tract Infection) is a bacterial infection of the bladder.  Most cases of urinary tract infections are simple to treat but a key part of your care is to encourage you to drink plenty of fluids and watch your symptoms carefully.  I have prescribed Keflex 500 mg twice a day for 7 days.  Your symptoms should gradually improve. Call us if the burning in your urine worsens, you develop worsening fever, back pain or pelvic pain or if your symptoms do not resolve after completing the antibiotic. I have also sent in a Diflucan just in case of any yeast symptoms from antibiotic use.   Urinary tract infections can be prevented by drinking plenty of water to keep your body hydrated.  Also be sure when you wipe, wipe from front to back and don't hold it in!  If possible, empty your bladder every 4 hours.  HOME CARE Drink plenty of fluids Compete the full course of the antibiotics even if the symptoms resolve Remember, when you need to go.go. Holding in your urine can increase the likelihood of getting a UTI! GET HELP RIGHT AWAY IF: You cannot urinate You get a high fever Worsening back pain occurs You see blood in your urine You feel sick to your stomach or throw up You feel like you are going to pass out  MAKE SURE YOU  Understand these instructions. Will watch your condition. Will get help right away if you are not doing well or get worse.   Thank you for choosing an e-visit.  Your e-visit answers were reviewed by a board certified advanced clinical practitioner to complete your personal care plan. Depending upon the condition, your plan could have included both over the counter or prescription medications.  Please review your pharmacy choice. Make sure the pharmacy is  open so you can pick up prescription now. If there is a problem, you may contact your provider through [employer] and have the prescription routed to another pharmacy.  Your safety is important to [place]. If you have drug allergies check your prescription carefully.   For the next 24 hours you can use MyChart to ask questions about today's visit, request a non-urgent call back, or ask for a work or school excuse. You will get an email in the next two days asking about your experience. I hope that your e-visit has been valuable and will speed your recovery.

## 2022-12-27 NOTE — Progress Notes (Signed)
I have spent 5 minutes in review of e-visit questionnaire, review and updating patient chart, medical decision making and response to patient.   Monica Hendrix Cody Eryanna Regal, PA-C    

## 2022-12-27 NOTE — Progress Notes (Signed)
Message sent to patient requesting further input regarding current symptoms. Awaiting patient response.  

## 2022-12-28 ENCOUNTER — Other Ambulatory Visit (HOSPITAL_COMMUNITY): Payer: Self-pay

## 2022-12-28 ENCOUNTER — Other Ambulatory Visit: Payer: Self-pay

## 2023-01-11 DIAGNOSIS — N939 Abnormal uterine and vaginal bleeding, unspecified: Secondary | ICD-10-CM | POA: Diagnosis not present

## 2023-01-11 DIAGNOSIS — D259 Leiomyoma of uterus, unspecified: Secondary | ICD-10-CM | POA: Diagnosis not present

## 2023-01-11 DIAGNOSIS — Z01419 Encounter for gynecological examination (general) (routine) without abnormal findings: Secondary | ICD-10-CM | POA: Diagnosis not present

## 2023-01-28 DIAGNOSIS — Z1231 Encounter for screening mammogram for malignant neoplasm of breast: Secondary | ICD-10-CM | POA: Diagnosis not present

## 2023-01-30 ENCOUNTER — Other Ambulatory Visit: Payer: Self-pay | Admitting: Obstetrics and Gynecology

## 2023-01-30 DIAGNOSIS — R928 Other abnormal and inconclusive findings on diagnostic imaging of breast: Secondary | ICD-10-CM

## 2023-02-05 DIAGNOSIS — Z1151 Encounter for screening for human papillomavirus (HPV): Secondary | ICD-10-CM | POA: Diagnosis not present

## 2023-02-05 DIAGNOSIS — Z1389 Encounter for screening for other disorder: Secondary | ICD-10-CM | POA: Diagnosis not present

## 2023-02-05 DIAGNOSIS — Z01419 Encounter for gynecological examination (general) (routine) without abnormal findings: Secondary | ICD-10-CM | POA: Diagnosis not present

## 2023-02-05 DIAGNOSIS — D259 Leiomyoma of uterus, unspecified: Secondary | ICD-10-CM | POA: Diagnosis not present

## 2023-02-05 DIAGNOSIS — Z124 Encounter for screening for malignant neoplasm of cervix: Secondary | ICD-10-CM | POA: Diagnosis not present

## 2023-03-14 ENCOUNTER — Other Ambulatory Visit: Payer: 59

## 2023-04-11 ENCOUNTER — Ambulatory Visit
Admission: RE | Admit: 2023-04-11 | Discharge: 2023-04-11 | Disposition: A | Discharge status: Home / Self Care | Payer: 59 | Source: Ambulatory Visit | Attending: Obstetrics and Gynecology | Admitting: Obstetrics and Gynecology

## 2023-04-11 DIAGNOSIS — R928 Other abnormal and inconclusive findings on diagnostic imaging of breast: Secondary | ICD-10-CM

## 2023-04-11 DIAGNOSIS — N6012 Diffuse cystic mastopathy of left breast: Secondary | ICD-10-CM | POA: Diagnosis not present

## 2023-05-02 ENCOUNTER — Other Ambulatory Visit (INDEPENDENT_AMBULATORY_CARE_PROVIDER_SITE_OTHER): Payer: 59

## 2023-05-02 ENCOUNTER — Other Ambulatory Visit: Payer: Self-pay | Admitting: Internal Medicine

## 2023-05-02 ENCOUNTER — Telehealth: Payer: Self-pay | Admitting: *Deleted

## 2023-05-02 ENCOUNTER — Ambulatory Visit
Admission: RE | Admit: 2023-05-02 | Discharge: 2023-05-02 | Disposition: A | Discharge status: Home / Self Care | Payer: 59 | Source: Ambulatory Visit | Attending: Internal Medicine | Admitting: Internal Medicine

## 2023-05-02 DIAGNOSIS — K5730 Diverticulosis of large intestine without perforation or abscess without bleeding: Secondary | ICD-10-CM | POA: Diagnosis not present

## 2023-05-02 DIAGNOSIS — R1030 Lower abdominal pain, unspecified: Secondary | ICD-10-CM

## 2023-05-02 DIAGNOSIS — R1032 Left lower quadrant pain: Secondary | ICD-10-CM | POA: Diagnosis not present

## 2023-05-02 LAB — COMPREHENSIVE METABOLIC PANEL
ALT: 14 U/L (ref 0–35)
AST: 21 U/L (ref 0–37)
Albumin: 4.2 g/dL (ref 3.5–5.2)
Alkaline Phosphatase: 72 U/L (ref 39–117)
BUN: 13 mg/dL (ref 6–23)
CO2: 28 meq/L (ref 19–32)
Calcium: 9.1 mg/dL (ref 8.4–10.5)
Chloride: 102 meq/L (ref 96–112)
Creatinine, Ser: 0.75 mg/dL (ref 0.40–1.20)
GFR: 94.43 mL/min (ref >60.00)
Glucose, Bld: 101 mg/dL — ABNORMAL HIGH (ref 70–99)
Potassium: 3.7 meq/L (ref 3.5–5.1)
Sodium: 139 meq/L (ref 135–145)
Total Bilirubin: 0.5 mg/dL (ref 0.2–1.2)
Total Protein: 7.7 g/dL (ref 6.0–8.3)

## 2023-05-02 LAB — CBC WITH DIFFERENTIAL/PLATELET
Basophils Absolute: 0.1 10*3/uL (ref 0.0–0.1)
Basophils Relative: 1 % (ref 0.0–3.0)
Eosinophils Absolute: 0.2 10*3/uL (ref 0.0–0.7)
Eosinophils Relative: 1.8 % (ref 0.0–5.0)
HCT: 35.4 % — ABNORMAL LOW (ref 36.0–46.0)
Hemoglobin: 11.6 g/dL — ABNORMAL LOW (ref 12.0–15.0)
Lymphocytes Relative: 30.4 % (ref 12.0–46.0)
Lymphs Abs: 3 10*3/uL (ref 0.7–4.0)
MCHC: 32.6 g/dL (ref 30.0–36.0)
MCV: 96.4 fL (ref 78.0–100.0)
Monocytes Absolute: 0.7 10*3/uL (ref 0.1–1.0)
Monocytes Relative: 6.9 % (ref 3.0–12.0)
Neutro Abs: 5.9 10*3/uL (ref 1.4–7.7)
Neutrophils Relative %: 59.9 % (ref 43.0–77.0)
Platelets: 413 10*3/uL — ABNORMAL HIGH (ref 150.0–400.0)
RBC: 3.67 Mil/uL — ABNORMAL LOW (ref 3.87–5.11)
RDW: 13.4 % (ref 11.5–15.5)
WBC: 9.8 10*3/uL (ref 4.0–10.5)

## 2023-05-02 MED ORDER — IOPAMIDOL (ISOVUE-300) INJECTION 61%
500.0000 mL | Freq: Once | INTRAVENOUS | Status: AC | PRN
  Administered 2023-05-02: 90 mL via INTRAVENOUS

## 2023-05-02 NOTE — Telephone Encounter (Signed)
Call report received from imaging. Informed them the report was available. Informed patient PCP that report is available.

## 2023-06-07 DIAGNOSIS — K5730 Diverticulosis of large intestine without perforation or abscess without bleeding: Secondary | ICD-10-CM | POA: Diagnosis not present

## 2023-06-07 DIAGNOSIS — D259 Leiomyoma of uterus, unspecified: Secondary | ICD-10-CM | POA: Diagnosis not present

## 2023-08-04 ENCOUNTER — Encounter: Payer: Self-pay | Admitting: Internal Medicine

## 2023-08-04 MED ORDER — BENZONATATE 200 MG PO CAPS
200.0000 mg | ORAL_CAPSULE | Freq: Three times a day (TID) | ORAL | 0 refills | Status: DC | PRN
  Filled 2023-08-04: qty 30, 10d supply, fill #0

## 2023-08-04 MED ORDER — OSELTAMIVIR PHOSPHATE 75 MG PO CAPS
75.0000 mg | ORAL_CAPSULE | Freq: Two times a day (BID) | ORAL | 0 refills | Status: AC
  Filled 2023-08-04: qty 10, 5d supply, fill #0

## 2023-08-05 ENCOUNTER — Other Ambulatory Visit (HOSPITAL_COMMUNITY): Payer: Self-pay

## 2023-09-03 ENCOUNTER — Ambulatory Visit (INDEPENDENT_AMBULATORY_CARE_PROVIDER_SITE_OTHER): Payer: 59 | Admitting: Internal Medicine

## 2023-09-03 ENCOUNTER — Encounter: Payer: Self-pay | Admitting: Internal Medicine

## 2023-09-03 ENCOUNTER — Ambulatory Visit: Payer: 59 | Admitting: Internal Medicine

## 2023-09-03 VITALS — BP 132/70 | HR 82 | Temp 97.9°F | Resp 16 | Ht 62.0 in | Wt 167.8 lb

## 2023-09-03 DIAGNOSIS — D500 Iron deficiency anemia secondary to blood loss (chronic): Secondary | ICD-10-CM

## 2023-09-03 DIAGNOSIS — K5720 Diverticulitis of large intestine with perforation and abscess without bleeding: Secondary | ICD-10-CM | POA: Insufficient documentation

## 2023-09-03 DIAGNOSIS — D75839 Thrombocytosis, unspecified: Secondary | ICD-10-CM | POA: Diagnosis not present

## 2023-09-03 DIAGNOSIS — B351 Tinea unguium: Secondary | ICD-10-CM | POA: Insufficient documentation

## 2023-09-03 LAB — BASIC METABOLIC PANEL
BUN: 11 mg/dL (ref 6–23)
CO2: 29 meq/L (ref 19–32)
Calcium: 8.5 mg/dL (ref 8.4–10.5)
Chloride: 103 meq/L (ref 96–112)
Creatinine, Ser: 0.82 mg/dL (ref 0.40–1.20)
GFR: 84.64 mL/min (ref >60.00)
Glucose, Bld: 103 mg/dL — ABNORMAL HIGH (ref 70–99)
Potassium: 4 meq/L (ref 3.5–5.1)
Sodium: 138 meq/L (ref 135–145)

## 2023-09-03 LAB — CBC WITH DIFFERENTIAL/PLATELET
Basophils Absolute: 0 10*3/uL (ref 0.0–0.1)
Basophils Relative: 0.5 % (ref 0.0–3.0)
Eosinophils Absolute: 0.2 10*3/uL (ref 0.0–0.7)
Eosinophils Relative: 2.6 % (ref 0.0–5.0)
HCT: 32.1 % — ABNORMAL LOW (ref 36.0–46.0)
Hemoglobin: 10.6 g/dL — ABNORMAL LOW (ref 12.0–15.0)
Lymphocytes Relative: 32.2 % (ref 12.0–46.0)
Lymphs Abs: 2.6 10*3/uL (ref 0.7–4.0)
MCHC: 33 g/dL (ref 30.0–36.0)
MCV: 93.2 fL (ref 78.0–100.0)
Monocytes Absolute: 0.6 10*3/uL (ref 0.1–1.0)
Monocytes Relative: 7.7 % (ref 3.0–12.0)
Neutro Abs: 4.6 10*3/uL (ref 1.4–7.7)
Neutrophils Relative %: 57 % (ref 43.0–77.0)
Platelets: 341 10*3/uL (ref 150.0–400.0)
RBC: 3.45 Mil/uL — ABNORMAL LOW (ref 3.87–5.11)
RDW: 14.8 % (ref 11.5–15.5)
WBC: 8.1 10*3/uL (ref 4.0–10.5)

## 2023-09-03 LAB — IBC + FERRITIN
Ferritin: 7.4 ng/mL — ABNORMAL LOW (ref 10.0–291.0)
Iron: 36 ug/dL — ABNORMAL LOW (ref 42–145)
Saturation Ratios: 8.7 % — ABNORMAL LOW (ref 20.0–50.0)
TIBC: 411.6 ug/dL (ref 250.0–450.0)
Transferrin: 294 mg/dL (ref 212.0–360.0)

## 2023-09-03 LAB — HEPATIC FUNCTION PANEL
ALT: 15 U/L (ref 0–35)
AST: 23 U/L (ref 0–37)
Albumin: 3.9 g/dL (ref 3.5–5.2)
Alkaline Phosphatase: 76 U/L (ref 39–117)
Bilirubin, Direct: 0.1 mg/dL (ref 0.0–0.3)
Total Bilirubin: 0.4 mg/dL (ref 0.2–1.2)
Total Protein: 7.3 g/dL (ref 6.0–8.3)

## 2023-09-03 MED ORDER — ACCRUFER 30 MG PO CAPS: 1.0000 | ORAL_CAPSULE | Freq: Two times a day (BID) | ORAL | 1 refills | Status: DC

## 2023-09-03 NOTE — Patient Instructions (Signed)

## 2023-09-03 NOTE — Progress Notes (Unsigned)
Subjective:  Patient ID: Monica Hendrix, female    DOB: January 20, 1976  Age: 48 y.o. MRN: [id number]  CC: Anemia   HPI Monica Hendrix presents for f/up ----  Discussed the use of AI scribe software for clinical note transcription with the patient, who gave verbal consent to proceed.  History of Present Illness   Monica Hendrix is a 48 year old female who presents with discoloration of the left toenail.  She noticed discoloration of her left toenail after removing gel polish yesterday. The discoloration is confined to the left toenail, with no similar changes in other toenails.  She typically uses gel polish and has it removed every six to eight weeks, indicating the discoloration may have been present for several weeks without her knowledge.  No pain or bleeding is associated with the toenail discoloration.       Outpatient Medications Prior to Visit  Medication Sig Dispense Refill   acetaminophen (TYLENOL) 325 MG tablet Take 2 tablets (650 mg total) by mouth every 6 (six) hours as needed for mild pain (or temp > 100).     Ascorbic Acid (VITAMIN C PO) Take by mouth.     b complex vitamins tablet Take 1 tablet by mouth daily.     cholecalciferol (VITAMIN D3) 25 MCG (1000 UT) tablet Take 1,000 Units by mouth 2 (two) times daily.     Multiple Vitamins-Minerals (MULTIVITAMIN WITH MINERALS) tablet Take 1 tablet by mouth daily.     benzonatate (TESSALON) 200 MG capsule Take 1 capsule (200 mg total) by mouth 3 (three) times daily as needed for cough. 30 capsule 0   naproxen (NAPROSYN) 375 MG tablet Take 1 tablet (375 mg total) by mouth 2 (two) times daily. 20 tablet 0   phenazopyridine (PYRIDIUM) 200 MG tablet Take 1 tablet (200 mg total) by mouth 3 (three) times daily. 6 tablet 0   No facility-administered medications prior to visit.    ROS Review of Systems  Objective:  BP 132/70 (BP Location: Left Arm, Patient Position: Sitting, Cuff Size: Normal)   Pulse 82    Temp 97.9 F (36.6 C) (Oral)   Resp 16   Ht 5\' 2"  (1.575 m)   Wt 167 lb 12.8 oz (76.1 kg)   LMP 08/20/2023 (Approximate)   SpO2 98%   BMI 30.69 kg/m   BP Readings from Last 3 Encounters:  09/03/23 132/70  04/23/22 126/74  03/08/22 126/80    Wt Readings from Last 3 Encounters:  09/03/23 167 lb 12.8 oz (76.1 kg)  04/23/22 160 lb (72.6 kg)  03/08/22 162 lb 6.4 oz (73.7 kg)    Physical Exam  Lab Results  Component Value Date   WBC 8.1 09/03/2023   HGB 10.6 (L) 09/03/2023   HCT 32.1 (L) 09/03/2023   PLT 341.0 09/03/2023   GLUCOSE 103 (H) 09/03/2023   CHOL 180 09/16/2020   TRIG 26.0 09/16/2020   HDL 73.50 09/16/2020   LDLCALC 102 (H) 09/16/2020   ALT 15 09/03/2023   AST 23 09/03/2023   NA 138 09/03/2023   K 4.0 09/03/2023   CL 103 09/03/2023   CREATININE 0.82 09/03/2023   BUN 11 09/03/2023   CO2 29 09/03/2023   TSH 1.070 01/01/2019   HGBA1C 5.4 10/24/2017    CT ABDOMEN PELVIS W CONTRAST Result Date: 05/02/2023 CLINICAL DATA:  LEFT lower quadrant abdominal pain. History of diverticulitis. EXAM: CT ABDOMEN AND PELVIS WITH CONTRAST TECHNIQUE: Multidetector CT imaging of the abdomen and  pelvis was performed using the standard protocol following bolus administration of intravenous contrast. RADIATION DOSE REDUCTION: This exam was performed according to the departmental dose-optimization program which includes automated exposure control, adjustment of the mA and/or kV according to patient size and/or use of iterative reconstruction technique. CONTRAST:  90mL ISOVUE-300 IOPAMIDOL (ISOVUE-300) INJECTION 61% COMPARISON:  None Available. FINDINGS: Lower chest: Lung bases are clear. Hepatobiliary: Several small hepatic cysts unchanged. No biliary duct dilatation. Normal gallbladder. Pancreas: Pancreas is normal. No ductal dilatation. No pancreatic inflammation. Spleen: Normal spleen Adrenals/urinary tract: Adrenal glands and kidneys are normal. The ureters and bladder normal.  Stomach/Bowel: Stomach, small bowel, appendix, and cecum are normal. Ascending and transverse colon normal. Several diverticula of the descending colon and sigmoid colon without acute inflammation. Rectum normal. Vascular/Lymphatic: Abdominal aorta is normal caliber. No periportal or retroperitoneal adenopathy. No pelvic adenopathy. Reproductive: Polypoid lesion within the endometrial canal of the lower uterine segment measuring 21 mm (image 67/2). The endometrial canal of the lower uterine segment is expanded with intermediate density tissue (sagittal image 62/5). Potential submucosal leiomyoma in the uterine fundus measuring 3.3 cm (image 64/2). Calcified intramural leiomyoma in the RIGHT wall of the uterine body measures 27 mm. Ovaries are normal. Other: No free fluid. Musculoskeletal: No aggressive osseous lesion. IMPRESSION: 1. Sigmoid diverticulosis without acute diverticulitis. 2. Polypoid lesion within lower uterine segment endometrial canal with intermediate density expansion of the lower uterine segment. Recommend gyn consultation and potential tissue sampling. 3. Normal ovaries. 4. Normal appendix and gallbladder. These results will be called to the ordering clinician or representative by the Radiologist Assistant, and communication documented in the PACS or [employer]. Electronically Signed   By: Genevive Bi M.D.   On: 05/02/2023 16:27    Assessment & Plan:  Thrombocytosis -     IBC + Ferritin; Future -     CBC with Differential/Platelet; Future -     Hepatic function panel; Future -     Basic metabolic panel; Future -     hCG, quantitative, pregnancy; Future  Onychomycosis of left great toe -     Hepatic function panel; Future -     Basic metabolic panel; Future -     hCG, quantitative, pregnancy; Future  Iron deficiency anemia due to chronic blood loss -     ACCRUFeR; Take 1 capsule (30 mg total) by mouth in the morning and at bedtime.  Dispense: 180 capsule; Refill: 1      Follow-up: Return in about 3 months (around 12/01/2023).  Sanda Linger, MD

## 2023-09-04 ENCOUNTER — Encounter: Payer: Self-pay | Admitting: Internal Medicine

## 2023-09-04 ENCOUNTER — Other Ambulatory Visit (HOSPITAL_COMMUNITY): Payer: Self-pay

## 2023-09-04 LAB — HCG, QUANTITATIVE, PREGNANCY: HCG, Total, QN: <5 m[IU]/mL

## 2023-09-04 MED ORDER — TERBINAFINE HCL 250 MG PO TABS
250.0000 mg | ORAL_TABLET | Freq: Every day | ORAL | 0 refills | Status: DC
  Filled 2023-09-04: qty 90, 90d supply, fill #0

## 2023-09-04 NOTE — Progress Notes (Signed)
Subjective:  Patient ID: Lawana Pai, female    DOB: 10/19/1975  Age: 48 y.o. MRN: [id number]  CC: No chief complaint on file.   HPI Latishia Suitt presents for duplicate  Outpatient Medications Prior to Visit  Medication Sig Dispense Refill   acetaminophen (TYLENOL) 325 MG tablet Take 2 tablets (650 mg total) by mouth every 6 (six) hours as needed for mild pain (or temp > 100).     Ascorbic Acid (VITAMIN C PO) Take by mouth.     b complex vitamins tablet Take 1 tablet by mouth daily.     cholecalciferol (VITAMIN D3) 25 MCG (1000 UT) tablet Take 1,000 Units by mouth 2 (two) times daily.     Ferric Maltol (ACCRUFER) 30 MG CAPS Take 1 capsule (30 mg total) by mouth in the morning and at bedtime. 180 capsule 1   Multiple Vitamins-Minerals (MULTIVITAMIN WITH MINERALS) tablet Take 1 tablet by mouth daily.     terbinafine (LAMISIL) 250 MG tablet Take 1 tablet (250 mg total) by mouth daily. 90 tablet 0   No facility-administered medications prior to visit.    ROS Review of Systems  Objective:  LMP 08/20/2023 (Approximate)   BP Readings from Last 3 Encounters:  09/03/23 132/70  04/23/22 126/74  03/08/22 126/80    Wt Readings from Last 3 Encounters:  09/03/23 167 lb 12.8 oz (76.1 kg)  04/23/22 160 lb (72.6 kg)  03/08/22 162 lb 6.4 oz (73.7 kg)    Physical Exam  Lab Results  Component Value Date   WBC 8.1 09/03/2023   HGB 10.6 (L) 09/03/2023   HCT 32.1 (L) 09/03/2023   PLT 341.0 09/03/2023   GLUCOSE 103 (H) 09/03/2023   CHOL 180 09/16/2020   TRIG 26.0 09/16/2020   HDL 73.50 09/16/2020   LDLCALC 102 (H) 09/16/2020   ALT 15 09/03/2023   AST 23 09/03/2023   NA 138 09/03/2023   K 4.0 09/03/2023   CL 103 09/03/2023   CREATININE 0.82 09/03/2023   BUN 11 09/03/2023   CO2 29 09/03/2023   TSH 1.070 01/01/2019   HGBA1C 5.4 10/24/2017    CT ABDOMEN PELVIS W CONTRAST Result Date: 05/02/2023 CLINICAL DATA:  LEFT lower quadrant abdominal pain. History of  diverticulitis. EXAM: CT ABDOMEN AND PELVIS WITH CONTRAST TECHNIQUE: Multidetector CT imaging of the abdomen and pelvis was performed using the standard protocol following bolus administration of intravenous contrast. RADIATION DOSE REDUCTION: This exam was performed according to the departmental dose-optimization program which includes automated exposure control, adjustment of the mA and/or kV according to patient size and/or use of iterative reconstruction technique. CONTRAST:  90mL ISOVUE-300 IOPAMIDOL (ISOVUE-300) INJECTION 61% COMPARISON:  None Available. FINDINGS: Lower chest: Lung bases are clear. Hepatobiliary: Several small hepatic cysts unchanged. No biliary duct dilatation. Normal gallbladder. Pancreas: Pancreas is normal. No ductal dilatation. No pancreatic inflammation. Spleen: Normal spleen Adrenals/urinary tract: Adrenal glands and kidneys are normal. The ureters and bladder normal. Stomach/Bowel: Stomach, small bowel, appendix, and cecum are normal. Ascending and transverse colon normal. Several diverticula of the descending colon and sigmoid colon without acute inflammation. Rectum normal. Vascular/Lymphatic: Abdominal aorta is normal caliber. No periportal or retroperitoneal adenopathy. No pelvic adenopathy. Reproductive: Polypoid lesion within the endometrial canal of the lower uterine segment measuring 21 mm (image 67/2). The endometrial canal of the lower uterine segment is expanded with intermediate density tissue (sagittal image 62/5). Potential submucosal leiomyoma in the uterine fundus measuring 3.3 cm (image 64/2). Calcified intramural leiomyoma in the RIGHT  wall of the uterine body measures 27 mm. Ovaries are normal. Other: No free fluid. Musculoskeletal: No aggressive osseous lesion. IMPRESSION: 1. Sigmoid diverticulosis without acute diverticulitis. 2. Polypoid lesion within lower uterine segment endometrial canal with intermediate density expansion of the lower uterine segment.  Recommend gyn consultation and potential tissue sampling. 3. Normal ovaries. 4. Normal appendix and gallbladder. These results will be called to the ordering clinician or representative by the Radiologist Assistant, and communication documented in the PACS or [employer]. Electronically Signed   By: Genevive Bi M.D.   On: 05/02/2023 16:27    Assessment & Plan:  Thrombocytosis -     hCG, quantitative, pregnancy -     Basic metabolic panel -     Hepatic function panel -     CBC with Differential/Platelet -     IBC + Ferritin  Onychomycosis of left great toe -     hCG, quantitative, pregnancy -     Basic metabolic panel -     Hepatic function panel     Follow-up: No follow-ups on file.  Sanda Linger, MD

## 2023-10-09 ENCOUNTER — Telehealth: Payer: Self-pay | Admitting: Internal Medicine

## 2023-10-09 NOTE — Telephone Encounter (Signed)
 We have received FMLA pw through the faxes and it has ben placed in PCP's box.   Please fax to: [phone]

## 2023-10-17 NOTE — Telephone Encounter (Signed)
 Patient states that she doesn't need these papers filled out.

## 2024-01-15 ENCOUNTER — Other Ambulatory Visit: Payer: Self-pay | Admitting: Internal Medicine

## 2024-01-15 DIAGNOSIS — D500 Iron deficiency anemia secondary to blood loss (chronic): Secondary | ICD-10-CM

## 2024-01-15 MED ORDER — ACCRUFER 30 MG PO CAPS: 1.0000 | ORAL_CAPSULE | Freq: Two times a day (BID) | ORAL | 0 refills | Status: DC

## 2024-02-02 ENCOUNTER — Telehealth: Admitting: Physician Assistant

## 2024-02-02 DIAGNOSIS — R3989 Other symptoms and signs involving the genitourinary system: Secondary | ICD-10-CM | POA: Diagnosis not present

## 2024-02-02 MED ORDER — CEPHALEXIN 500 MG PO CAPS: 500.0000 mg | ORAL_CAPSULE | Freq: Two times a day (BID) | ORAL | 0 refills | Status: AC

## 2024-02-02 MED ORDER — PHENAZOPYRIDINE HCL 100 MG PO TABS
100.0000 mg | ORAL_TABLET | Freq: Three times a day (TID) | ORAL | 0 refills | Status: AC | PRN
Start: 2024-02-02 — End: 2024-02-04

## 2024-02-02 NOTE — Addendum Note (Signed)
 Addended byBETHA ROLAN BERTHOLD on: 02/02/2024 02:17 PM   Modules accepted: Orders

## 2024-02-02 NOTE — Progress Notes (Signed)

## 2024-02-02 NOTE — Progress Notes (Signed)
 I have spent 5 minutes in review of e-visit questionnaire, review and updating patient chart, medical decision making and response to patient.   Laure Kidney, PA-C

## 2024-02-20 DIAGNOSIS — D259 Leiomyoma of uterus, unspecified: Secondary | ICD-10-CM | POA: Diagnosis not present

## 2024-02-20 DIAGNOSIS — Z01419 Encounter for gynecological examination (general) (routine) without abnormal findings: Secondary | ICD-10-CM | POA: Diagnosis not present

## 2024-02-20 DIAGNOSIS — Z1389 Encounter for screening for other disorder: Secondary | ICD-10-CM | POA: Diagnosis not present

## 2024-02-20 DIAGNOSIS — Z130 Encounter for screening for diseases of the blood and blood-forming organs and certain disorders involving the immune mechanism: Secondary | ICD-10-CM | POA: Diagnosis not present

## 2024-02-27 ENCOUNTER — Ambulatory Visit (INDEPENDENT_AMBULATORY_CARE_PROVIDER_SITE_OTHER): Admitting: Internal Medicine

## 2024-02-27 ENCOUNTER — Encounter: Payer: Self-pay | Admitting: Internal Medicine

## 2024-02-27 VITALS — BP 132/70 | HR 80 | Temp 98.2°F | Resp 16 | Ht 62.0 in | Wt 162.0 lb

## 2024-02-27 DIAGNOSIS — Z0000 Encounter for general adult medical examination without abnormal findings: Secondary | ICD-10-CM | POA: Diagnosis not present

## 2024-02-27 DIAGNOSIS — R030 Elevated blood-pressure reading, without diagnosis of hypertension: Secondary | ICD-10-CM | POA: Diagnosis not present

## 2024-02-27 DIAGNOSIS — Z6829 Body mass index (BMI) 29.0-29.9, adult: Secondary | ICD-10-CM | POA: Diagnosis not present

## 2024-02-27 DIAGNOSIS — E559 Vitamin D deficiency, unspecified: Secondary | ICD-10-CM

## 2024-02-27 DIAGNOSIS — D500 Iron deficiency anemia secondary to blood loss (chronic): Secondary | ICD-10-CM | POA: Diagnosis not present

## 2024-02-27 DIAGNOSIS — K5720 Diverticulitis of large intestine with perforation and abscess without bleeding: Secondary | ICD-10-CM | POA: Insufficient documentation

## 2024-02-27 NOTE — Progress Notes (Signed)
 "  Subjective:  Patient ID: Monica Hendrix, female    DOB: [birth year]  Age: 48 y.o. MRN: [id number]  CC: Annual Exam and Anemia   HPI Monica Hendrix presents for a CPX and f/up ----  Discussed the use of AI scribe software for clinical note transcription with the patient, who gave verbal consent to proceed.  History of Present Illness Monica Hendrix is a 48 year old female who presents with long, heavy menstrual periods.  She experiences prolonged and heavy menstrual bleeding, with her last cycle starting on July 31st. She is not using any form of contraception and is not taking Estoril. She takes an iron supplement and Acuvail . No symptoms of anemia, weakness, fatigue, or shortness of breath are present. She remains active despite her symptoms.  Additionally, she has a mammogram and ultrasound scheduled on September 3rd. Her last Pap smear was two weeks ago during her annual visit.  She works in teacher, music and is unsure about her hepatitis B vaccination status. She reports no recent changes in weight or appetite, noting a slight weight loss. She engages in physical activity by walking for at least 30 minutes, often at a country park for peace of mind. She feels good during these activities and reports no cardiovascular symptoms such as chest pain, shortness of breath, dizziness, lightheadedness, palpitations, or swelling in her legs or feet.  No recent symptoms of diverticulitis. She maintains a diet high in natural fiber, including fruits, vegetables, and grains.     Outpatient Medications Prior to Visit  Medication Sig Dispense Refill   b complex vitamins tablet Take 1 tablet by mouth daily.     Multiple Vitamins-Minerals (MULTIVITAMIN WITH MINERALS) tablet Take 1 tablet by mouth daily.     cholecalciferol  (VITAMIN D3) 25 MCG (1000 UT) tablet Take 1,000 Units by mouth 2 (two) times daily.     Ferric Maltol  (ACCRUFER ) 30 MG CAPS Take 1 capsule (30 mg total) by mouth in  the morning and at bedtime. 180 capsule 0   terbinafine  (LAMISIL ) 250 MG tablet Take 1 tablet (250 mg total) by mouth daily. 90 tablet 0   No facility-administered medications prior to visit.    ROS Review of Systems  Constitutional:  Negative for appetite change, chills, diaphoresis, fatigue and fever.  HENT: Negative.    Eyes: Negative.   Respiratory:  Negative for cough, chest tightness, shortness of breath and wheezing.   Cardiovascular:  Negative for chest pain, palpitations and leg swelling.  Gastrointestinal:  Negative for abdominal pain, constipation, diarrhea, nausea and vomiting.  Endocrine: Negative.   Genitourinary:  Positive for menstrual problem. Negative for difficulty urinating and vaginal bleeding.  Musculoskeletal: Negative.   Skin: Negative.   Neurological:  Negative for dizziness, weakness, light-headedness and numbness.  Hematological:  Negative for adenopathy. Does not bruise/bleed easily.  Psychiatric/Behavioral: Negative.      Objective:  BP 132/70 (BP Location: Left Arm, Patient Position: Sitting, Cuff Size: Normal)   Pulse 80   Temp 98.2 F (36.8 C) (Oral)   Resp 16   Ht 5' 2 (1.575 m)   Wt 162 lb (73.5 kg)   LMP 02/13/2024   SpO2 99%   BMI 29.63 kg/m   BP Readings from Last 3 Encounters:  02/27/24 132/70  09/03/23 132/70  04/23/22 126/74    Wt Readings from Last 3 Encounters:  02/27/24 162 lb (73.5 kg)  09/03/23 167 lb 12.8 oz (76.1 kg)  04/23/22 160 lb (72.6 kg)    Physical  Exam Vitals reviewed.  Constitutional:      Appearance: Normal appearance.  HENT:     Mouth/Throat:     Mouth: Mucous membranes are moist.  Eyes:     General: No scleral icterus.    Conjunctiva/sclera: Conjunctivae normal.  Cardiovascular:     Rate and Rhythm: Normal rate and regular rhythm.     Heart sounds: No murmur heard.    No friction rub. No gallop.  Pulmonary:     Effort: Pulmonary effort is normal.     Breath sounds: No stridor. No wheezing,  rhonchi or rales.  Abdominal:     General: Abdomen is flat.     Palpations: There is no mass.     Tenderness: There is no abdominal tenderness. There is no guarding.     Hernia: No hernia is present.  Musculoskeletal:        General: Normal range of motion.     Cervical back: Neck supple.     Right lower leg: No edema.     Left lower leg: No edema.  Lymphadenopathy:     Cervical: No cervical adenopathy.  Skin:    General: Skin is warm and dry.  Neurological:     General: No focal deficit present.     Mental Status: She is alert.  Psychiatric:        Mood and Affect: Mood normal.        Behavior: Behavior normal.     Lab Results  Component Value Date   WBC 8.6 03/05/2024   HGB 10.9 (L) 03/05/2024   HCT 32.6 (L) 03/05/2024   PLT 360.0 03/05/2024   GLUCOSE 91 03/05/2024   CHOL 181 03/05/2024   TRIG 29.0 03/05/2024   HDL 71.40 03/05/2024   LDLCALC 103 (H) 03/05/2024   ALT 13 03/05/2024   AST 20 03/05/2024   NA 137 03/05/2024   K 4.3 03/05/2024   CL 103 03/05/2024   CREATININE 0.70 03/05/2024   BUN 13 03/05/2024   CO2 26 03/05/2024   TSH 1.16 03/05/2024   HGBA1C 5.4 10/24/2017    CT ABDOMEN PELVIS W CONTRAST Result Date: 05/02/2023 CLINICAL DATA:  LEFT lower quadrant abdominal pain. History of diverticulitis. EXAM: CT ABDOMEN AND PELVIS WITH CONTRAST TECHNIQUE: Multidetector CT imaging of the abdomen and pelvis was performed using the standard protocol following bolus administration of intravenous contrast. RADIATION DOSE REDUCTION: This exam was performed according to the departmental dose-optimization program which includes automated exposure control, adjustment of the mA and/or kV according to patient size and/or use of iterative reconstruction technique. CONTRAST:  90mL ISOVUE -300 IOPAMIDOL  (ISOVUE -300) INJECTION 61% COMPARISON:  None Available. FINDINGS: Lower chest: Lung bases are clear. Hepatobiliary: Several small hepatic cysts unchanged. No biliary duct  dilatation. Normal gallbladder. Pancreas: Pancreas is normal. No ductal dilatation. No pancreatic inflammation. Spleen: Normal spleen Adrenals/urinary tract: Adrenal glands and kidneys are normal. The ureters and bladder normal. Stomach/Bowel: Stomach, small bowel, appendix, and cecum are normal. Ascending and transverse colon normal. Several diverticula of the descending colon and sigmoid colon without acute inflammation. Rectum normal. Vascular/Lymphatic: Abdominal aorta is normal caliber. No periportal or retroperitoneal adenopathy. No pelvic adenopathy. Reproductive: Polypoid lesion within the endometrial canal of the lower uterine segment measuring 21 mm (image 67/2). The endometrial canal of the lower uterine segment is expanded with intermediate density tissue (sagittal image 62/5). Potential submucosal leiomyoma in the uterine fundus measuring 3.3 cm (image 64/2). Calcified intramural leiomyoma in the RIGHT wall of the uterine body measures 27 mm.  Ovaries are normal. Other: No free fluid. Musculoskeletal: No aggressive osseous lesion. IMPRESSION: 1. Sigmoid diverticulosis without acute diverticulitis. 2. Polypoid lesion within lower uterine segment endometrial canal with intermediate density expansion of the lower uterine segment. Recommend gyn consultation and potential tissue sampling. 3. Normal ovaries. 4. Normal appendix and gallbladder. These results will be called to the ordering clinician or representative by the Radiologist Assistant, and communication documented in the PACS or [employer]. Electronically Signed   By: Jackquline Boxer M.D.   On: 05/02/2023 16:27    Assessment & Plan:  BMI 29.0-29.9,adult -     Hepatic function panel; Future -     Basic metabolic panel with GFR; Future -     TSH; Future  Routine general medical examination at a health care facility- Exam completed, labs reviewed, vaccines reviewed, cancer screenings are UTD, pt ed material was given.  -     Lipid panel;  Future -     Hepatitis B surface antibody,quantitative; Future -     Hepatitis A antibody, total; Future  Iron deficiency anemia due to chronic blood loss -     CBC with Differential/Platelet; Future -     IBC + Ferritin; Future -     ACCRUFeR ; Take 1 capsule (30 mg total) by mouth in the morning and at bedtime.  Dispense: 180 capsule; Refill: 0  Prehypertension -     Basic metabolic panel with GFR; Future -     Urinalysis, Routine w reflex microscopic; Future  Vitamin D  deficiency -     VITAMIN D  25 Hydroxy (Vit-D Deficiency, Fractures); Future -     Cholecalciferol ; Take 1 capsule (50,000 Units total) by mouth once a week.  Dispense: 12 capsule; Refill: 0     Follow-up: No follow-ups on file.  Debby Molt, MD "

## 2024-03-02 DIAGNOSIS — Z6829 Body mass index (BMI) 29.0-29.9, adult: Secondary | ICD-10-CM | POA: Insufficient documentation

## 2024-03-02 DIAGNOSIS — E559 Vitamin D deficiency, unspecified: Secondary | ICD-10-CM | POA: Insufficient documentation

## 2024-03-02 DIAGNOSIS — R030 Elevated blood-pressure reading, without diagnosis of hypertension: Secondary | ICD-10-CM | POA: Insufficient documentation

## 2024-03-05 ENCOUNTER — Other Ambulatory Visit (HOSPITAL_COMMUNITY): Payer: Self-pay

## 2024-03-05 ENCOUNTER — Ambulatory Visit: Payer: Self-pay | Admitting: Internal Medicine

## 2024-03-05 ENCOUNTER — Other Ambulatory Visit (INDEPENDENT_AMBULATORY_CARE_PROVIDER_SITE_OTHER)

## 2024-03-05 DIAGNOSIS — E559 Vitamin D deficiency, unspecified: Secondary | ICD-10-CM | POA: Diagnosis not present

## 2024-03-05 DIAGNOSIS — D500 Iron deficiency anemia secondary to blood loss (chronic): Secondary | ICD-10-CM | POA: Diagnosis not present

## 2024-03-05 DIAGNOSIS — R030 Elevated blood-pressure reading, without diagnosis of hypertension: Secondary | ICD-10-CM

## 2024-03-05 DIAGNOSIS — Z6829 Body mass index (BMI) 29.0-29.9, adult: Secondary | ICD-10-CM | POA: Diagnosis not present

## 2024-03-05 DIAGNOSIS — Z0000 Encounter for general adult medical examination without abnormal findings: Secondary | ICD-10-CM | POA: Diagnosis not present

## 2024-03-05 LAB — LIPID PANEL
Cholesterol: 181 mg/dL (ref 0–200)
HDL: 71.4 mg/dL (ref >39.00)
LDL Cholesterol: 103 mg/dL — ABNORMAL HIGH (ref 0–99)
NonHDL: 109.29
Total CHOL/HDL Ratio: 3
Triglycerides: 29 mg/dL (ref 0.0–149.0)
VLDL: 5.8 mg/dL (ref 0.0–40.0)

## 2024-03-05 LAB — CBC WITH DIFFERENTIAL/PLATELET
Basophils Absolute: 0.1 K/uL (ref 0.0–0.1)
Basophils Relative: 0.6 % (ref 0.0–3.0)
Eosinophils Absolute: 0.2 K/uL (ref 0.0–0.7)
Eosinophils Relative: 2.8 % (ref 0.0–5.0)
HCT: 32.6 % — ABNORMAL LOW (ref 36.0–46.0)
Hemoglobin: 10.9 g/dL — ABNORMAL LOW (ref 12.0–15.0)
Lymphocytes Relative: 24.3 % (ref 12.0–46.0)
Lymphs Abs: 2.1 K/uL (ref 0.7–4.0)
MCHC: 33.6 g/dL (ref 30.0–36.0)
MCV: 95.9 fl (ref 78.0–100.0)
Monocytes Absolute: 0.6 K/uL (ref 0.1–1.0)
Monocytes Relative: 7.3 % (ref 3.0–12.0)
Neutro Abs: 5.6 K/uL (ref 1.4–7.7)
Neutrophils Relative %: 65 % (ref 43.0–77.0)
Platelets: 360 K/uL (ref 150.0–400.0)
RBC: 3.4 Mil/uL — ABNORMAL LOW (ref 3.87–5.11)
RDW: 13.7 % (ref 11.5–15.5)
WBC: 8.6 K/uL (ref 4.0–10.5)

## 2024-03-05 LAB — URINALYSIS, ROUTINE W REFLEX MICROSCOPIC
Bilirubin Urine: NEGATIVE
Hgb urine dipstick: NEGATIVE
Ketones, ur: NEGATIVE
Leukocytes,Ua: NEGATIVE
Nitrite: NEGATIVE
RBC / HPF: NONE SEEN (ref 0–?)
Specific Gravity, Urine: 1.015 (ref 1.000–1.030)
Total Protein, Urine: NEGATIVE
Urine Glucose: NEGATIVE
Urobilinogen, UA: 0.2 (ref 0.0–1.0)
pH: 7 (ref 5.0–8.0)

## 2024-03-05 LAB — BASIC METABOLIC PANEL WITH GFR
BUN: 13 mg/dL (ref 6–23)
CO2: 26 meq/L (ref 19–32)
Calcium: 8.3 mg/dL — ABNORMAL LOW (ref 8.4–10.5)
Chloride: 103 meq/L (ref 96–112)
Creatinine, Ser: 0.7 mg/dL (ref 0.40–1.20)
GFR: 101.97 mL/min (ref >60.00)
Glucose, Bld: 91 mg/dL (ref 70–99)
Potassium: 4.3 meq/L (ref 3.5–5.1)
Sodium: 137 meq/L (ref 135–145)

## 2024-03-05 LAB — HEPATIC FUNCTION PANEL
ALT: 13 U/L (ref 0–35)
AST: 20 U/L (ref 0–37)
Albumin: 3.9 g/dL (ref 3.5–5.2)
Alkaline Phosphatase: 62 U/L (ref 39–117)
Bilirubin, Direct: 0.1 mg/dL (ref 0.0–0.3)
Total Bilirubin: 0.4 mg/dL (ref 0.2–1.2)
Total Protein: 7 g/dL (ref 6.0–8.3)

## 2024-03-05 LAB — IBC + FERRITIN
Ferritin: 6.8 ng/mL — ABNORMAL LOW (ref 10.0–291.0)
Iron: 32 ug/dL — ABNORMAL LOW (ref 42–145)
Saturation Ratios: 8.2 % — ABNORMAL LOW (ref 20.0–50.0)
TIBC: 389.2 ug/dL (ref 250.0–450.0)
Transferrin: 278 mg/dL (ref 212.0–360.0)

## 2024-03-05 LAB — TSH: TSH: 1.16 u[IU]/mL (ref 0.35–5.50)

## 2024-03-05 LAB — VITAMIN D 25 HYDROXY (VIT D DEFICIENCY, FRACTURES): VITD: 16.65 ng/mL — ABNORMAL LOW (ref 30.00–100.00)

## 2024-03-05 MED ORDER — ACCRUFER 30 MG PO CAPS: 1.0000 | ORAL_CAPSULE | Freq: Two times a day (BID) | ORAL | 0 refills | Status: AC

## 2024-03-05 MED ORDER — CHOLECALCIFEROL 1.25 MG (50000 UT) PO CAPS
50000.0000 [IU] | ORAL_CAPSULE | ORAL | 0 refills | Status: AC
  Filled 2024-03-05: qty 12, 84d supply, fill #0

## 2024-03-06 LAB — HEPATITIS B SURFACE ANTIBODY, QUANTITATIVE: Hep B S AB Quant (Post): >1000 m[IU]/mL (ref >=10)

## 2024-03-06 LAB — HEPATITIS A ANTIBODY, TOTAL: Hepatitis A AB,Total: NONREACTIVE

## 2024-03-19 DIAGNOSIS — N939 Abnormal uterine and vaginal bleeding, unspecified: Secondary | ICD-10-CM | POA: Diagnosis not present

## 2024-03-19 DIAGNOSIS — D259 Leiomyoma of uterus, unspecified: Secondary | ICD-10-CM | POA: Diagnosis not present

## 2024-03-19 DIAGNOSIS — Z1231 Encounter for screening mammogram for malignant neoplasm of breast: Secondary | ICD-10-CM | POA: Diagnosis not present

## 2024-03-27 ENCOUNTER — Other Ambulatory Visit: Payer: Self-pay | Admitting: Obstetrics and Gynecology

## 2024-03-27 DIAGNOSIS — N6489 Other specified disorders of breast: Secondary | ICD-10-CM
